# Patient Record
Sex: Female | Born: 1950 | Race: White | Hispanic: No | Marital: Married | Smoking: Never smoker
Health system: Southern US, Community
[De-identification: ages and names within clinical notes are randomized; demographics above are authoritative.]

## PROBLEM LIST (undated history)

## (undated) DIAGNOSIS — I10 Essential (primary) hypertension: Secondary | ICD-10-CM

## (undated) DIAGNOSIS — E785 Hyperlipidemia, unspecified: Secondary | ICD-10-CM

## (undated) HISTORY — PX: TUBAL LIGATION: SHX77

## (undated) HISTORY — DX: Essential (primary) hypertension: I10

## (undated) HISTORY — DX: Hyperlipidemia, unspecified: E78.5

---

## 2002-08-03 ENCOUNTER — Ambulatory Visit (HOSPITAL_COMMUNITY): Admission: RE | Admit: 2002-08-03 | Discharge: 2002-08-03 | Payer: Self-pay

## 2003-12-14 ENCOUNTER — Ambulatory Visit (HOSPITAL_COMMUNITY): Admission: RE | Admit: 2003-12-14 | Discharge: 2003-12-14 | Payer: Self-pay | Admitting: Internal Medicine

## 2005-05-11 ENCOUNTER — Ambulatory Visit: Payer: Self-pay | Admitting: Gastroenterology

## 2005-05-25 ENCOUNTER — Ambulatory Visit: Payer: Self-pay | Admitting: Gastroenterology

## 2006-10-02 ENCOUNTER — Encounter: Admission: RE | Admit: 2006-10-02 | Discharge: 2006-10-02 | Payer: Self-pay | Admitting: Obstetrics and Gynecology

## 2008-05-06 ENCOUNTER — Ambulatory Visit: Payer: Self-pay | Admitting: Obstetrics & Gynecology

## 2008-05-11 ENCOUNTER — Ambulatory Visit (HOSPITAL_COMMUNITY): Admission: RE | Admit: 2008-05-11 | Discharge: 2008-05-11 | Payer: Self-pay | Admitting: Obstetrics & Gynecology

## 2008-06-02 ENCOUNTER — Ambulatory Visit: Payer: Self-pay | Admitting: Obstetrics and Gynecology

## 2010-10-15 ENCOUNTER — Encounter: Payer: Self-pay | Admitting: Obstetrics and Gynecology

## 2011-02-06 NOTE — Group Therapy Note (Signed)
NAMESHAWNTE, Andrea Morrow NO.:  [id number]   MEDICAL RECORD NO.:  [id number]          PATIENT TYPE:  WOC   LOCATION:  [facility]                   FACILITY:  [facility]   PHYSICIAN:  Argentina Donovan, MD        DATE OF BIRTH:  04/14/1951   DATE OF SERVICE:  06/02/2008                                  CLINIC NOTE   REASON FOR VISIT:  Followup from May 06, 2008, visit.   HISTORY:  This is a 60 year old G3 P3 who was originally referred here  from [facility] when they did her Pap screen in May for evaluation of  possible vulvar lesion needing biopsy.  She was seen by Dr. Marice Potter on  May 06, 2008, and there were no vulvar lesions.  She did have some  small perirectal lesions that appeared consistent with herpes, and she  did have hemorrhoids at that visit, so the plan was made to return here  to see if there was any lesion needing a biopsy.  Since she was seen,  she has had no further itching although she does think that her  hemorrhoids may be worse.  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  Denies  constipation and uses sitz baths in order to have bowel movements when  she does feel constipation as she has in the past.  She is unaware of  ever having herpes.   OBJECTIVE:  BP 130/93, weight 157, height 5'1.  Abbreviated physical  exam.  External genitalia:  Again, no vulvar lesion is seen.  There  appears to be some glistening skin of the posterior perirectal area  consistent with mild irritation.  There are three or four 1 cm sized  anterior external hemorrhoids visible.  No thrombosis or tenderness.   ASSESSMENT:  1. Hemorrhoids with mild constipation.  2. Chronic hypertension.  3. Hyperlipidemia.  4. No evidence of herpes simplex virus II by antibody testing.   PLAN:  Discussed all the usual treatments for hemorrhoids and danger  signs of thrombosis.  Urged to alter her diet to decrease constipation  or try stool softener if ever needed.  She is reassured that HSV II  antibody is negative and that she has no lesions of the vulva.  Followup  will be at [facility] for her routine Pap and with her primary  physician, who manages her chronic hypertension.  She is also advised  that her mammogram of August 18 is negative.  Return here p.r.n.     ______________________________  Caren Griffins, CNM    ______________________________  Argentina Donovan, MD    DP/MEDQ  D:  06/02/2008  T:  06/02/2008  Job:  [id number]

## 2012-09-12 ENCOUNTER — Telehealth: Payer: Self-pay | Admitting: *Deleted

## 2012-09-12 MED ORDER — SIMVASTATIN 40 MG PO TABS: 40.0000 mg | ORAL_TABLET | Freq: Every day | ORAL | Status: DC

## 2012-09-12 MED ORDER — HYDROCHLOROTHIAZIDE 25 MG PO TABS: 25.0000 mg | ORAL_TABLET | Freq: Every day | ORAL | Status: DC

## 2012-09-12 NOTE — Telephone Encounter (Signed)
Message copied by Mora Bellman on Fri Sep 12, 2012 11:59 AM ------      Message from: Lizbeth Bark      Created: Fri Sep 12, 2012 10:27 AM      Regarding: FW: phone message      Contact: [phone]       See below about refills      ----- Message -----         From: Ralene Cork, DO         Sent: 09/12/2012  10:06 AM           To: Inez Pilgrim Ceresi      Subject: RE: phone message                                        Needs to find a new PCP... I recommend Dr Silas [place] at [facility] or Dr Larita Fife in [place].      Ok to refill both meds for one month. Forward message to Roselene Gray or [place] to call these in. Each med is one po QD.            ----- Message -----         From: Lizbeth Bark         Sent: 09/12/2012   9:07 AM           To: Ralene Cork, DO      Subject: phone message                                            Pt wants to know if you still want to be her families PCP?  If not please give some recommendations on where they should go.  Thanks!            Also, she needs a refill on her meds. Simvastatin 40 mg, Hydrochlothiazide 25 mg. 100 tablets  Pharmacy is [facility]

## 2012-10-08 ENCOUNTER — Ambulatory Visit: Payer: Self-pay | Admitting: Family Medicine

## 2012-10-08 VITALS — BP 156/85 | HR 75 | Temp 98.4°F | Resp 18 | Ht 61.5 in | Wt 162.0 lb

## 2012-10-08 DIAGNOSIS — E785 Hyperlipidemia, unspecified: Secondary | ICD-10-CM

## 2012-10-08 DIAGNOSIS — I10 Essential (primary) hypertension: Secondary | ICD-10-CM

## 2012-10-08 LAB — COMPREHENSIVE METABOLIC PANEL
ALT: 15 U/L (ref 0–35)
Albumin: 4.8 g/dL (ref 3.5–5.2)
CO2: 31 mEq/L (ref 19–32)
Calcium: 10 mg/dL (ref 8.4–10.5)
Chloride: 103 mEq/L (ref 96–112)
Glucose, Bld: 89 mg/dL (ref 70–99)
Sodium: 142 mEq/L (ref 135–145)
Total Protein: 7 g/dL (ref 6.0–8.3)

## 2012-10-08 LAB — LIPID PANEL
Cholesterol: 224 mg/dL — ABNORMAL HIGH (ref 0–200)
Triglycerides: 166 mg/dL — ABNORMAL HIGH (ref <150)

## 2012-10-08 MED ORDER — SIMVASTATIN 40 MG PO TABS: 40.0000 mg | ORAL_TABLET | Freq: Every day | ORAL | Status: DC

## 2012-10-08 MED ORDER — HYDROCHLOROTHIAZIDE 25 MG PO TABS: 25.0000 mg | ORAL_TABLET | Freq: Every day | ORAL | Status: DC

## 2012-10-08 NOTE — Patient Instructions (Signed)
Keep a record of your blood pressures outside of the office and bring them to the next office visit. If they are running over 140/90 - you may need to have another medicine added. Work on walking/exercise as we discussed, and recheck in 6 months. Return to the clinic or go to the nearest emergency room if any of your symptoms worsen or new symptoms occur. We will request records form Dr. Gilmer Mor office.

## 2012-10-08 NOTE — Progress Notes (Signed)
Subjective:    Patient ID: Andrea Morrow, female    DOB: 1950-12-01, 62 y.o.   MRN: [id number]  HPI Andrea Morrow is a 62 y.o. female Prior patient of Dr. Margaretha Sheffield.  Hx of HTN , hyperlipidemia.  Last ate about 7-8 hours. \ Outside BP's - 120-130/70-80's. No missed doses of BP meds.  No new myalgias or other new side effects of Zocor.  Same doses of meds past few years.  [place]are exercise[place]  11/13 - had normal pap smear at [facility], mammogram normal 2013. Colonoscopy in 2007 - normal - follow up in 10 years. No flu vaccine this year.   Past Medical History  Diagnosis Date  . Hypertension   . Hyperlipidemia    Current Outpatient Prescriptions on File Prior to Visit  Medication Sig Dispense Refill  . hydrochlorothiazide (HYDRODIURIL) 25 MG tablet Take 1 tablet (25 mg total) by mouth daily.  90 tablet  1  . simvastatin (ZOCOR) 40 MG tablet Take 1 tablet (40 mg total) by mouth daily.  90 tablet  1   No Known Allergies Past Surgical History  Procedure Date  . Tubal ligation    History   Social History  . Marital Status: Married    Spouse Name: N/A    Number of Children: N/A  . Years of Education: N/A   Occupational History  . Not on file.   Social History Main Topics  . Smoking status: Never Smoker   . Smokeless tobacco: Not on file  . Alcohol Use: Yes  . Drug Use: No  . Sexually Active: Not on file   Other Topics Concern  . Not on file   Social History Narrative  . No narrative on file      Review of Systems  Constitutional: Negative for fatigue and unexpected weight change.  Respiratory: Negative for chest tightness and shortness of breath.   Cardiovascular: Negative for chest pain, palpitations and leg swelling.  Gastrointestinal: Negative for abdominal pain and blood in stool.  Neurological: Negative for dizziness, syncope, light-headedness and headaches.       Objective:   Physical Exam  Vitals  reviewed. Constitutional: She is oriented to person, place, and time. She appears well-developed and well-nourished.  HENT:  Head: Normocephalic and atraumatic.  Eyes: Conjunctivae normal and EOM are normal. Pupils are equal, round, and reactive to light.  Neck: Carotid bruit is not present.  Cardiovascular: Normal rate, regular rhythm, normal heart sounds and intact distal pulses.   Pulmonary/Chest: Effort normal and breath sounds normal.  Abdominal: Soft. She exhibits no pulsatile midline mass. There is no tenderness.  Neurological: She is alert and oriented to person, place, and time.  Skin: Skin is warm and dry.  Psychiatric: She has a normal mood and affect. Her behavior is normal.       Assessment & Plan:  Andrea Morrow is a 62 y.o. female 1. HTN (hypertension)  Comprehensive metabolic panel, hydrochlorothiazide (HYDRODIURIL) 25 MG tablet  2. Hyperlipidemia  Comprehensive metabolic panel, Lipid panel, simvastatin (ZOCOR) 40 MG tablet   HTn - borderline high, but controlled at home.  No change in meds at present, but as below if running high - rtc.   Hyperlipidemia - check lipids, CMP.  Continue same meds for now.   Recheck in 6 months.  Patient Instructions  Keep a record of your blood pressures outside of the office and bring them to the next office visit. If they are  running over 140/90 - you may need to have another medicine added. Work on walking/exercise as we discussed, and recheck in 6 months. Return to the clinic or go to the nearest emergency room if any of your symptoms worsen or new symptoms occur. We will request records form Dr. Gilmer Mor office.

## 2013-04-22 ENCOUNTER — Other Ambulatory Visit: Payer: Self-pay | Admitting: Family Medicine

## 2013-04-27 ENCOUNTER — Encounter: Payer: Self-pay | Admitting: Family Medicine

## 2013-04-27 ENCOUNTER — Ambulatory Visit: Payer: Self-pay | Admitting: Family Medicine

## 2013-04-27 VITALS — BP 126/82 | HR 77 | Temp 98.6°F | Resp 16 | Ht 61.5 in | Wt 164.0 lb

## 2013-04-27 DIAGNOSIS — M25561 Pain in right knee: Secondary | ICD-10-CM

## 2013-04-27 DIAGNOSIS — E785 Hyperlipidemia, unspecified: Secondary | ICD-10-CM

## 2013-04-27 DIAGNOSIS — I10 Essential (primary) hypertension: Secondary | ICD-10-CM

## 2013-04-27 LAB — COMPREHENSIVE METABOLIC PANEL
AST: 18 U/L (ref 0–37)
BUN: 21 mg/dL (ref 6–23)
CO2: 31 mEq/L (ref 19–32)
Chloride: 101 mEq/L (ref 96–112)
Creat: 0.77 mg/dL (ref 0.50–1.10)
Glucose, Bld: 91 mg/dL (ref 70–99)
Potassium: 4 mEq/L (ref 3.5–5.3)
Total Protein: 7 g/dL (ref 6.0–8.3)

## 2013-04-27 MED ORDER — HYDROCHLOROTHIAZIDE 25 MG PO TABS: 25.0000 mg | ORAL_TABLET | Freq: Every day | ORAL | Status: DC

## 2013-04-27 MED ORDER — SIMVASTATIN 40 MG PO TABS: 40.0000 mg | ORAL_TABLET | Freq: Every day | ORAL | Status: DC

## 2013-04-27 NOTE — Patient Instructions (Signed)
Schedule physical in next 3-6 months.  Will check cholesterol then. Tylenol or if needed - ibuprofen as needed for right knee pain - if this worsens - recheck.  You should receive a call or letter about your lab results within the next week to 10 days.

## 2013-04-27 NOTE — Progress Notes (Signed)
Subjective:    Patient ID: Andrea Morrow, female    DOB: [birth year], 62 y.o.   MRN: [id number]  HPI Andrea Morrow is a 62 y.o. female  HTn - borderline high last ov with better readings at home. Continued hctz 25mg  qd. Home readings:  114-145/75-89. No chest pains, Occasional calf soreness, cramps. - both sides - goes away with ibuprofen. Occasional soreness in R knee with bending in. No injury.  No soreness currently - walking ok, not noticed during day, no mechanical sx's.   Hyperlipidemia - lipids, CMP as below from last ov in January. Continued on Zocor 40mg  qd. Planned on exercise, healthy food choices and recheck 6 months. Weight 162 that visit. No new diet, no new exercise regimen.  Exercise: less than 1 day per week. Minimal sweet tea, minimal sodas. Plans on increasing walking. Occasional soreness in calves, no other new myalgias.  Results for orders placed in visit on 10/08/12  COMPREHENSIVE METABOLIC PANEL      Result Value Range   Sodium 142  135 - 145 mEq/L   Potassium 4.0  3.5 - 5.3 mEq/L   Chloride 103  96 - 112 mEq/L   CO2 31  19 - 32 mEq/L   Glucose, Bld 89  70 - 99 mg/dL   BUN 16  6 - 23 mg/dL   Creat 7.82  9.56 - 2.13 mg/dL   Total Bilirubin 0.4  0.3 - 1.2 mg/dL   Alkaline Phosphatase 57  39 - 117 U/L   AST 21  0 - 37 U/L   ALT 15  0 - 35 U/L   Total Protein 7.0  6.0 - 8.3 g/dL   Albumin 4.8  3.5 - 5.2 g/dL   Calcium 08.6  8.4 - 57.8 mg/dL  LIPID PANEL      Result Value Range   Cholesterol 224 (*) 0 - 200 mg/dL   Triglycerides 469 (*) <150 mg/dL   HDL 70  >62 mg/dL   Total CHOL/HDL Ratio 3.2     VLDL 33  0 - 40 mg/dL   LDL Cholesterol 952 (*) 0 - 99 mg/dL   Last physical last year?  Review of Systems  Constitutional: Negative for fatigue and unexpected weight change.  Respiratory: Negative for chest tightness and shortness of breath.   Cardiovascular: Negative for chest pain, palpitations and leg swelling.  Gastrointestinal: Negative for abdominal pain  and blood in stool.  Musculoskeletal: Positive for arthralgias.  Skin: Negative for rash.  Neurological: Negative for dizziness, syncope, light-headedness and headaches.       Objective:   Physical Exam  Vitals reviewed. Constitutional: She is oriented to person, place, and time. She appears well-developed and well-nourished.  HENT:  Head: Normocephalic and atraumatic.  Eyes: Conjunctivae and EOM are normal. Pupils are equal, round, and reactive to light.  Neck: Carotid bruit is not present.  Cardiovascular: Normal rate, regular rhythm, normal heart sounds and intact distal pulses.   Pulmonary/Chest: Effort normal and breath sounds normal.  Abdominal: Soft. She exhibits no pulsatile midline mass. There is no tenderness.  Musculoskeletal:       Right knee: She exhibits normal range of motion, no swelling, no effusion, no erythema, normal alignment, no LCL laxity, normal meniscus (negative mc murray. ) and no MCL laxity. Tenderness found. Medial joint line tenderness noted. No lateral joint line, no MCL, no LCL and no patellar tendon tenderness noted.       Right lower leg: She exhibits no tenderness and  no swelling.       Left lower leg: She exhibits no tenderness and no swelling.  Neurological: She is alert and oriented to person, place, and time.  Skin: Skin is warm and dry.  Psychiatric: She has a normal mood and affect. Her behavior is normal.      Assessment & Plan:  Andrea Morrow is a 62 y.o. female Hyperlipidemia - Plan: Comprehensive metabolic panel, simvastatin (ZOCOR) 40 MG tablet.  Borderline prior, but good HDL.  Discussed diet changes and walking for exercise.   HTN (hypertension) - Plan: Comprehensive metabolic panel, hydrochlorothiazide (HYDRODIURIL) 25 MG tablet.  Controlled.  meds refilled. Will check lytes with hx intermittent calf myalgias. rtc if change/increase in sx's.    Right knee pain - ttp over medial jt line - possible oa vs degenerative meniscal dz.  If  more frequent sxs - rtc for recheck or XR.   Plan on CPE in next 6 months.    Meds ordered this encounter  Medications  . hydrochlorothiazide (HYDRODIURIL) 25 MG tablet    Sig: Take 1 tablet (25 mg total) by mouth daily.    Dispense:  90 tablet    Refill:  1  . simvastatin (ZOCOR) 40 MG tablet    Sig: Take 1 tablet (40 mg total) by mouth daily.    Dispense:  90 tablet    Refill:  1   Patient Instructions  Schedule physical in next 3-6 months.  Will check cholesterol then. Tylenol or if needed - ibuprofen as needed for right knee pain - if this worsens - recheck.  You should receive a call or letter about your lab results within the next week to 10 days.

## 2013-08-10 ENCOUNTER — Ambulatory Visit: Payer: Self-pay | Admitting: Family Medicine

## 2013-08-10 ENCOUNTER — Encounter: Payer: Self-pay | Admitting: Family Medicine

## 2013-08-10 VITALS — BP 124/86 | HR 80 | Temp 98.6°F | Resp 12 | Ht 61.75 in | Wt 166.6 lb

## 2013-08-10 DIAGNOSIS — E785 Hyperlipidemia, unspecified: Secondary | ICD-10-CM

## 2013-08-10 DIAGNOSIS — I10 Essential (primary) hypertension: Secondary | ICD-10-CM

## 2013-08-10 NOTE — Patient Instructions (Addendum)
Exercise - walking is a great start - most days of the week and portion control. recheck in 3 months for physical (fasting labs that morning). If motivating - step tracker/pedometer or fitness tracking bracelet may help.    No change in meds at this point. Let me know if there are any questions before your next appointment, and if refills needed before that appointment - please let me know.

## 2013-08-10 NOTE — Progress Notes (Signed)
Subjective:    Patient ID: Andrea Morrow, female    DOB: [birth year], 62 y.o.   MRN: [id number]  HPI [name] is a 62 y.o. female  Last OV 04/2013.   HTN - creat 0.77 in August. Still taking HCTZ 25mg  QD. Home BP128/80 this am.  Usually 118-124/80's. No new side effects of meds.   Hyperlipidemia -  on Zocor 40mg  qd.  No new diet, exercise: less than 1 day per week at last ov - now even less. Busy with family and traveling and work. Prior walking for exercise.  Daughter moved to [place] in June,  family with surgeries. Ran out of fish oil but was tolerating this.    Results for orders placed in visit on 04/27/13  COMPREHENSIVE METABOLIC PANEL      Result Value Range   Sodium 137  135 - 145 mEq/L   Potassium 4.0  3.5 - 5.3 mEq/L   Chloride 101  96 - 112 mEq/L   CO2 31  19 - 32 mEq/L   Glucose, Bld 91  70 - 99 mg/dL   BUN 21  6 - 23 mg/dL   Creat 6.21  3.08 - 6.57 mg/dL   Total Bilirubin 0.4  0.3 - 1.2 mg/dL   Alkaline Phosphatase 54  39 - 117 U/L   AST 18  0 - 37 U/L   ALT 14  0 - 35 U/L   Total Protein 7.0  6.0 - 8.3 g/dL   Albumin 4.5  3.5 - 5.2 g/dL   Calcium 9.8  8.4 - 84.6 mg/dL   Lab Results  Component Value Date   CHOL 224* 10/08/2012   Lab Results  Component Value Date   HDL 70 10/08/2012   Lab Results  Component Value Date   LDLCALC 121* 10/08/2012   Lab Results  Component Value Date   TRIG 166* 10/08/2012   Lab Results  Component Value Date   CHOLHDL 3.2 10/08/2012   No results found for this basename: LDLDIRECT   Review of Systems  Constitutional: Negative for fatigue and unexpected weight change.  Respiratory: Negative for chest tightness and shortness of breath.   Cardiovascular: Negative for chest pain, palpitations and leg swelling.  Gastrointestinal: Negative for abdominal pain and blood in stool.  Neurological: Negative for dizziness, syncope, light-headedness and headaches.       Objective:   Physical Exam  Vitals  reviewed. Constitutional: She is oriented to person, place, and time. She appears well-developed and well-nourished.  HENT:  Head: Normocephalic and atraumatic.  Eyes: Conjunctivae and EOM are normal. Pupils are equal, round, and reactive to light.  Neck: Carotid bruit is not present.  Cardiovascular: Normal rate, regular rhythm, normal heart sounds and intact distal pulses.   Pulmonary/Chest: Effort normal and breath sounds normal.  Abdominal: Soft. She exhibits no pulsatile midline mass. There is no tenderness.  Neurological: She is alert and oriented to person, place, and time.  Skin: Skin is warm and dry.  Psychiatric: She has a normal mood and affect. Her behavior is normal.        Assessment & Plan:  Andrea Morrow is a 62 y.o. female HTN (hypertension) - stable.controlled. Exercise discussed as below. Fasting labs at physical in few months.   Other and unspecified hyperlipidemia - not at control at last check and discussed exercise and diet/protion control necessity.reasonable goals and ways to incorporate exercise into daily routine discussed.   Not fasting today - plan on lipids at  physical in next 3 months.   No orders of the defined types were placed in this encounter.   Patient Instructions  Exercise - walking is a great start - most days of the week and portion control. recheck in 3 months for physical (fasting labs that morning). If motivating - step tracker/pedometer or fitness tracking bracelet may help.    No change in meds at this point. Let me know if there are any questions before your next appointment, and if refills needed before that appointment - please let me know.

## 2013-08-11 NOTE — Progress Notes (Signed)
Patient made appointment for Feb. 2015.

## 2013-11-16 ENCOUNTER — Encounter: Payer: Self-pay | Admitting: Family Medicine

## 2013-11-16 ENCOUNTER — Ambulatory Visit: Payer: Self-pay | Admitting: Family Medicine

## 2013-11-16 VITALS — BP 146/90 | HR 76 | Temp 98.0°F | Resp 16 | Ht 61.5 in | Wt 167.8 lb

## 2013-11-16 DIAGNOSIS — I10 Essential (primary) hypertension: Secondary | ICD-10-CM

## 2013-11-16 DIAGNOSIS — E785 Hyperlipidemia, unspecified: Secondary | ICD-10-CM

## 2013-11-16 DIAGNOSIS — E782 Mixed hyperlipidemia: Secondary | ICD-10-CM

## 2013-11-16 LAB — COMPLETE METABOLIC PANEL WITH GFR
ALT: 16 U/L (ref 0–35)
AST: 22 U/L (ref 0–37)
Albumin: 4.6 g/dL (ref 3.5–5.2)
Alkaline Phosphatase: 51 U/L (ref 39–117)
BUN: 13 mg/dL (ref 6–23)
CO2: 30 mEq/L (ref 19–32)
CREATININE: 0.83 mg/dL (ref 0.50–1.10)
Calcium: 9.8 mg/dL (ref 8.4–10.5)
Chloride: 102 mEq/L (ref 96–112)
GFR, Est African American: 88 mL/min
GFR, Est Non African American: 76 mL/min
Glucose, Bld: 90 mg/dL (ref 70–99)
Potassium: 4.1 mEq/L (ref 3.5–5.3)
Sodium: 141 mEq/L (ref 135–145)
TOTAL PROTEIN: 7.4 g/dL (ref 6.0–8.3)
Total Bilirubin: 0.5 mg/dL (ref 0.2–1.2)

## 2013-11-16 LAB — LIPID PANEL
CHOL/HDL RATIO: 2.9 ratio
Cholesterol: 200 mg/dL (ref 0–200)
HDL: 69 mg/dL (ref >39)
LDL Cholesterol: 112 mg/dL — ABNORMAL HIGH (ref 0–99)
TRIGLYCERIDES: 94 mg/dL (ref <150)
VLDL: 19 mg/dL (ref 0–40)

## 2013-11-16 MED ORDER — HYDROCHLOROTHIAZIDE 25 MG PO TABS: 25.0000 mg | ORAL_TABLET | Freq: Every day | ORAL | Status: DC

## 2013-11-16 MED ORDER — SIMVASTATIN 40 MG PO TABS: 40.0000 mg | ORAL_TABLET | Freq: Every day | ORAL | Status: DC

## 2013-11-16 NOTE — Patient Instructions (Signed)
Continue same doses of medicines for now. You should receive a call or letter about your lab results within the next week to 10 days.  Physical in 3 months. If any new symptoms prior to that time - especially with exercise -return sooner to discuss.  Bring your blood pressure meter to next visit.

## 2013-11-16 NOTE — Progress Notes (Signed)
Subjective:    Patient ID: Andrea Morrow, female    DOB: Jun 03, 1951, 63 y.o.   MRN: [id number]  HPI Andrea Morrow is a 63 y.o. female Here for follow up.    HTN - on hctz 25mg  qd. Outside Bp's - 125-132/80-83 on home cuff.  No new side effects. Father died at 60 with MI, no known earlier CAD/early FH CAD.  mom living - 52 yo. No chest pain with exercise.  No new dyspnea on exertion. No missed doses of meds.     Chemistry      Component Value Date/Time   NA 137 04/27/2013 1356   K 4.0 04/27/2013 1356   CL 101 04/27/2013 1356   CO2 31 04/27/2013 1356   BUN 21 04/27/2013 1356   CREATININE 0.77 04/27/2013 1356      Component Value Date/Time   CALCIUM 9.8 04/27/2013 1356   ALKPHOS 54 04/27/2013 1356   AST 18 04/27/2013 1356   ALT 14 04/27/2013 1356   BILITOT 0.4 04/27/2013 1356      Hyperlipidemia  - On zocor 40mg  qd. Fasting labs drawn today. No new myalgias. Exercise: no new exercise, but [place]   Lab Results  Component Value Date   CHOL 224* 10/08/2012   Lab Results  Component Value Date   HDL 70 10/08/2012   Lab Results  Component Value Date   LDLCALC 121* 10/08/2012   Lab Results  Component Value Date   TRIG 166* 10/08/2012   Lab Results  Component Value Date   CHOLHDL 3.2 10/08/2012   No results found for this basename: LDLDIRECT   There are no active problems to display for this patient.  Past Medical History  Diagnosis Date  . Hypertension   . Hyperlipidemia    Past Surgical History  Procedure Laterality Date  . Tubal ligation     No Known Allergies Prior to Admission medications   Medication Sig Start Date End Date Taking? Authorizing Provider  hydrochlorothiazide (HYDRODIURIL) 25 MG tablet Take 1 tablet (25 mg total) by mouth daily. 04/27/13  Yes [name], MD  Multiple Vitamins-Calcium (ONE-A-DAY WOMENS PO) Take by mouth.   Yes Historical Provider, MD  Omega-3 Fatty Acids (FISH OIL PO) Take by mouth.   Yes Historical Provider, MD    simvastatin (ZOCOR) 40 MG tablet Take 1 tablet (40 mg total) by mouth daily. 04/27/13  Yes [name], MD   History   Social History  . Marital Status: Married    Spouse Name: N/A    Number of Children: N/A  . Years of Education: N/A   Occupational History  . Not on file.   Social History Main Topics  . Smoking status: Never Smoker   . Smokeless tobacco: Not on file  . Alcohol Use: Yes  . Drug Use: No  . Sexual Activity: Not on file   Other Topics Concern  . Not on file   Social History Narrative  . No narrative on file    Review of Systems  Constitutional: Negative for fatigue and unexpected weight change.  Respiratory: Negative for chest tightness and shortness of breath.   Cardiovascular: Negative for chest pain, palpitations and leg swelling.  Gastrointestinal: Negative for abdominal pain and blood in stool.  Neurological: Negative for dizziness, syncope, light-headedness and headaches.       Objective:   Physical Exam  Vitals reviewed. Constitutional: She is oriented to person, place, and time. She appears  well-developed and well-nourished.  HENT:  Head: Normocephalic and atraumatic.  Eyes: Conjunctivae and EOM are normal. Pupils are equal, round, and reactive to light.  Neck: Carotid bruit is not present.  Cardiovascular: Normal rate, regular rhythm, normal heart sounds and intact distal pulses.   Pulmonary/Chest: Effort normal and breath sounds normal.  Abdominal: Soft. She exhibits no pulsatile midline mass. There is no tenderness.  Neurological: She is alert and oriented to person, place, and time.  Skin: Skin is warm and dry.  Psychiatric: She has a normal mood and affect. Her behavior is normal.   Filed Vitals:   11/16/13 1321  BP: 146/90  Pulse: 76  Temp: 98 F (36.7 C)  TempSrc: Oral  Resp: 16  Height: 5' 1.5" (1.562 m)  Weight: 167 lb 12.8 oz (76.114 kg)  SpO2: 98%        Assessment & Plan:   Andrea Morrow is a 63 y.o.  female HTN (hypertension) - Plan: hydrochlorothiazide (HYDRODIURIL) 25 MG tablet, COMPLETE METABOLIC PANEL WITH GFR, Lipid panel  -possible white coat component. Check home bp's, bring meter to next ov to verify accuracy, CMP GFR and lipids pending, CPE in next few months. Activity/exercise discussed.   Hyperlipidemia - Plan: simvastatin (ZOCOR) 40 MG tablet, COMPLETE METABOLIC PANEL WITH GFR, Lipid panel  -cont zocor at current dose until results of lipid panel.    Meds ordered this encounter  Medications  . hydrochlorothiazide (HYDRODIURIL) 25 MG tablet    Sig: Take 1 tablet (25 mg total) by mouth daily.    Dispense:  90 tablet    Refill:  1  . simvastatin (ZOCOR) 40 MG tablet    Sig: Take 1 tablet (40 mg total) by mouth daily.    Dispense:  90 tablet    Refill:  1   Patient Instructions  Continue same doses of medicines for now. You should receive a call or letter about your lab results within the next week to 10 days.  Physical in 3 months. If any new symptoms prior to that time - especially with exercise -return sooner to discuss.  Bring your blood pressure meter to next visit.

## 2014-01-28 ENCOUNTER — Encounter: Payer: Self-pay | Admitting: *Deleted

## 2014-01-28 DIAGNOSIS — Z1231 Encounter for screening mammogram for malignant neoplasm of breast: Secondary | ICD-10-CM | POA: Insufficient documentation

## 2014-02-18 ENCOUNTER — Encounter: Payer: Self-pay | Admitting: Family Medicine

## 2014-05-10 ENCOUNTER — Other Ambulatory Visit: Payer: Self-pay | Admitting: Family Medicine

## 2014-05-17 ENCOUNTER — Encounter: Payer: Self-pay | Admitting: Family Medicine

## 2014-06-16 ENCOUNTER — Other Ambulatory Visit: Payer: Self-pay | Admitting: Family Medicine

## 2014-09-27 ENCOUNTER — Encounter: Payer: Self-pay | Admitting: Family Medicine

## 2014-09-27 ENCOUNTER — Ambulatory Visit (INDEPENDENT_AMBULATORY_CARE_PROVIDER_SITE_OTHER): Payer: Self-pay | Admitting: Family Medicine

## 2014-09-27 VITALS — BP 131/84 | HR 91 | Temp 98.0°F | Resp 16 | Ht 61.75 in | Wt 166.6 lb

## 2014-09-27 DIAGNOSIS — Z23 Encounter for immunization: Secondary | ICD-10-CM

## 2014-09-27 DIAGNOSIS — Z0000 Encounter for general adult medical examination without abnormal findings: Secondary | ICD-10-CM

## 2014-09-27 DIAGNOSIS — E785 Hyperlipidemia, unspecified: Secondary | ICD-10-CM

## 2014-09-27 DIAGNOSIS — I10 Essential (primary) hypertension: Secondary | ICD-10-CM

## 2014-09-27 LAB — COMPLETE METABOLIC PANEL WITH GFR
ALT: 21 U/L (ref 0–35)
AST: 25 U/L (ref 0–37)
Albumin: 4.6 g/dL (ref 3.5–5.2)
Alkaline Phosphatase: 57 U/L (ref 39–117)
BILIRUBIN TOTAL: 0.6 mg/dL (ref 0.2–1.2)
BUN: 17 mg/dL (ref 6–23)
CO2: 28 mEq/L (ref 19–32)
CREATININE: 0.85 mg/dL (ref 0.50–1.10)
Calcium: 10.1 mg/dL (ref 8.4–10.5)
Chloride: 100 mEq/L (ref 96–112)
GFR, EST AFRICAN AMERICAN: 85 mL/min
GFR, EST NON AFRICAN AMERICAN: 74 mL/min
GLUCOSE: 89 mg/dL (ref 70–99)
Potassium: 3.8 mEq/L (ref 3.5–5.3)
Sodium: 139 mEq/L (ref 135–145)
TOTAL PROTEIN: 7.5 g/dL (ref 6.0–8.3)

## 2014-09-27 LAB — LIPID PANEL
CHOLESTEROL: 251 mg/dL — AB (ref 0–200)
HDL: 82 mg/dL (ref >39)
LDL Cholesterol: 148 mg/dL — ABNORMAL HIGH (ref 0–99)
Total CHOL/HDL Ratio: 3.1 Ratio
Triglycerides: 107 mg/dL (ref <150)
VLDL: 21 mg/dL (ref 0–40)

## 2014-09-27 MED ORDER — SIMVASTATIN 40 MG PO TABS: 40.0000 mg | ORAL_TABLET | Freq: Every day | ORAL | Status: DC

## 2014-09-27 MED ORDER — HYDROCHLOROTHIAZIDE 25 MG PO TABS: 25.0000 mg | ORAL_TABLET | Freq: Every day | ORAL | Status: DC

## 2014-09-27 NOTE — Progress Notes (Signed)
   Subjective:    Patient ID: Andrea Morrow, female    DOB: 08-09-1951, 64 y.o.   MRN: [id number]  HPI    Review of Systems  Constitutional: Negative.   HENT: Negative.   Eyes: Negative.   Respiratory: Negative.   Cardiovascular: Negative.   Gastrointestinal: Negative.   Endocrine: Negative.   Genitourinary: Negative.   Musculoskeletal: Negative.   Skin: Negative.   Allergic/Immunologic: Negative.   Neurological: Negative.   Hematological: Negative.   Psychiatric/Behavioral: Negative.        Objective:   Physical Exam        Assessment & Plan:

## 2014-09-27 NOTE — Progress Notes (Addendum)
Subjective:    Patient ID: Andrea Morrow, female    DOB: 1950/12/05, 64 y.o.   MRN: [id number] This chart was scribed for [name], MD by Zola Button, Medical Scribe. This patient was seen in Room 28 and the patient's care was started at 1:59 PM.   HPI HPI Comments: Andrea Morrow is a 64 y.o. female who presents to the [facility] for a complete physical exam.   Health Maintenance: -Exercise: She states she has not been getting enough exercise recently. -Eye care: Optho last year, she has an upcoming last year. -Dentist: She will have an appointment in a few months. -Fall screening: negative. -Depression screening: negative with PHQ2 score of 0.  -Colonoscopy: per patient report, this was 8 years ago. No suspicious findings. She was told to repeat in 10 years. -Mammogram: April 2015. No suspicious findings. -Cervical cancer screening: Patient last had a pap smear 1 year ago which was normal. She has not had any abnormal pap smears.  -Immunization: She declines a flu vaccine today, but plans to get one at [facility]. Tetanus: patient is unsure when her last tetanus shot was, but may be around 10 years ago. TDAP: she does not think she is UTD. -Bone density screening: per patient report, 8 years ago. She is not a smoker. Body mass index is 30.74 kg/(m^2). No increased risk of osteoporosis from review of risk factors on Up To Date. Plan on BMD at 60.  Advanced Directive: She has done the advanced directive, but she says that some changes need to be made.  HTN: Takes HCTZ 25 mg QD. She has not been taking her BP recently; she stopped checking around September 2015. Last kidney function test was normal February 2015. Patient has not had anything to eat or drink today. She denies any side effects due to her medications.  HLD: Lipid panel: She had a lipid panel with LDL 112 in February 2015. Otherwise normal. CMP within normal range at that time. Was advised to start ASA  once a day for stroke prevention. No known contraindications of ASA use. No hx of ulcers.  Patient has been having concerns about cancer. Her mother recently passed away at the age of 63; she was initially diagnosed with breast cancer in her 33s and had them removed. Her younger maternal cousin who turned 48 recently was diagnosed with colon cancer. Her 47 year old aunt and another cousin also have breast cancer. She is concerned because of cancer on her mother's side of the family. None of her siblings have been diagnosed with cancer.    Patient Active Problem List   Diagnosis Date Noted  . Other screening mammogram 01/28/2014   Past Medical History  Diagnosis Date  . Hypertension   . Hyperlipidemia    Past Surgical History  Procedure Laterality Date  . Tubal ligation     No Known Allergies Prior to Admission medications   Medication Sig Start Date End Date Taking? Authorizing Provider  hydrochlorothiazide (HYDRODIURIL) 25 MG tablet TAKE 1 TABLET BY MOUTH DAILY. 06/17/14  Yes Mancel Bale, PA-C  Multiple Vitamins-Calcium (ONE-A-DAY WOMENS PO) Take by mouth.   Yes Historical Provider, MD  simvastatin (ZOCOR) 40 MG tablet TAKE 1 TABLET BY MOUTH DAILY. 06/17/14  Yes Mancel Bale, PA-C  Omega-3 Fatty Acids (FISH OIL PO) Take by mouth.    Historical Provider, MD   History   Social History  . Marital Status: Married    Spouse Name: N/A  Number of Children: N/A  . Years of Education: N/A   Occupational History  . Not on file.   Social History Main Topics  . Smoking status: Never Smoker   . Smokeless tobacco: Not on file  . Alcohol Use: Yes  . Drug Use: No  . Sexual Activity: Not on file   Other Topics Concern  . Not on file   Social History Narrative   Married   Clinical research associate         Review of Systems  Constitutional: Negative for fatigue and unexpected weight change.  Respiratory: Negative for chest tightness and shortness of breath.   Cardiovascular:  Negative for chest pain, palpitations and leg swelling.  Gastrointestinal: Negative for abdominal pain and blood in stool.  Neurological: Negative for dizziness, syncope, light-headedness and headaches.   13 point ROS reviewed per health survey.     Objective:   Physical Exam  Constitutional: She is oriented to person, place, and time. She appears well-developed and well-nourished.  HENT:  Head: Normocephalic and atraumatic.  Right Ear: External ear normal.  Left Ear: External ear normal.  Mouth/Throat: Oropharynx is clear and moist.  Eyes: Conjunctivae and EOM are normal. Pupils are equal, round, and reactive to light.  Neck: Normal range of motion. Neck supple. Carotid bruit is not present. No thyromegaly present.  Cardiovascular: Normal rate, regular rhythm, normal heart sounds and intact distal pulses.   No murmur heard. Pulmonary/Chest: Effort normal and breath sounds normal. No respiratory distress. She has no wheezes.  Declined CBE.   Abdominal: Soft. Bowel sounds are normal. She exhibits no pulsatile midline mass. There is no tenderness.  Musculoskeletal: Normal range of motion. She exhibits no edema or tenderness.  Lymphadenopathy:    She has no cervical adenopathy.  Neurological: She is alert and oriented to person, place, and time.  Skin: Skin is warm and dry. No rash noted.  Psychiatric: She has a normal mood and affect. Her behavior is normal. Thought content normal.  Vitals reviewed.    Filed Vitals:   09/27/14 1320  BP: 131/84  Pulse: 91  Temp: 98 F (36.7 C)  TempSrc: Oral  Resp: 16  Height: 5' 1.75" (1.568 m)  Weight: 166 lb 9.6 oz (75.569 kg)  SpO2: 96%    Visual Acuity Screening   Right eye Left eye Both eyes  Without correction:     With correction: 20/15 20/20 20/15       Assessment & Plan:   Andrea Morrow is a 64 y.o. female Annual physical exam -anticipatory guidance as below in AVS, screening labs above. Health maintenance items as above in  HPI discussed/recommended as applicable. Plans on flu vaccine at [place] given.   Essential hypertension - Plan: hydrochlorothiazide (HYDRODIURIL) 25 MG tablet, COMPLETE METABOLIC PANEL WITH GFR  -stable. meds refilled at prior doses.   Hyperlipemia - Plan: simvastatin (ZOCOR) 40 MG tablet, Lipid panel.  refilled meds at same dose.    Need for Tdap vaccination - Plan: Tdap vaccine greater than or equal to 7yo IM given.    Meds ordered this encounter  Medications  . hydrochlorothiazide (HYDRODIURIL) 25 MG tablet    Sig: Take 1 tablet (25 mg total) by mouth daily.    Dispense:  90 tablet    Refill:  1  . simvastatin (ZOCOR) 40 MG tablet    Sig: Take 1 tablet (40 mg total) by mouth daily.    Dispense:  90 tablet    Refill:  1   Patient Instructions    You should receive a call or letter about your lab results within the next week to 10 days.  Get your flu shot at [place] given today.  Keeping You Healthy  Get These Tests  Blood Pressure- Have your blood pressure checked by your healthcare provider at least once a year.  Normal blood pressure is 120/80.  Weight- Have your body mass index (BMI) calculated to screen for obesity.  BMI is a measure of body fat based on height and weight.  You can calculate your own BMI at [employer]  Cholesterol- Have your cholesterol checked every year.  Diabetes- Have your blood sugar checked every year if you have high blood pressure, high cholesterol, a family history of diabetes or if you are overweight.  Pap Smear- Have a pap smear every 1 to 3 years if you have been sexually active.  If you are older than 65 and recent pap smears have been normal you may not need additional pap smears.  In addition, if you have had a hysterectomy  For benign disease additional pap smears are not necessary.  Mammogram-Yearly mammograms are essential for early detection of breast cancer  Screening for Colon Cancer- Colonoscopy starting  at age 45. Screening may begin sooner depending on your family history and other health conditions.  Follow up colonoscopy as directed by your Gastroenterologist.  Screening for Osteoporosis- Screening begins at age 55 with bone density scanning, sooner if you are at higher risk for developing Osteoporosis.  Get these medicines  Calcium with Vitamin D- Your body requires 1200-1500 mg of Calcium a day and [phone] IU of Vitamin D a day.  You can only absorb 500 mg of Calcium at a time therefore Calcium must be taken in 2 or 3 separate doses throughout the day.  Hormones- Hormone therapy has been associated with increased risk for certain cancers and heart disease.  Talk to your healthcare provider about if you need relief from menopausal symptoms.  Aspirin- Ask your healthcare provider about taking Aspirin to prevent Heart Disease and Stroke.  Get these Immuniztions  Flu shot- Every fall  Pneumonia shot- Once after the age of 70; if you are younger ask your healthcare provider if you need a pneumonia shot.  Tetanus- Every ten years.  Zostavax- Once after the age of 11 to prevent shingles.  Take these steps  Don't smoke- Your healthcare provider can help you quit. For tips on how to quit, ask your healthcare provider or go to www.smokefree.gov or call 1-800 QUIT-NOW.  Be physically active- Exercise 5 days a week for a minimum of 30 minutes.  If you are not already physically active, start slow and gradually work up to 30 minutes of moderate physical activity.  Try walking, dancing, bike riding, swimming, etc.  Eat a healthy diet- Eat a variety of healthy foods such as fruits, vegetables, whole grains, low fat milk, low fat cheeses, yogurt, lean meats, chicken, fish, eggs, dried beans, tofu, etc.  For more information go to www.thenutritionsource.org  Dental visit- Brush and floss teeth twice daily; visit your dentist twice a year.  Eye exam- Visit your Optometrist or Ophthalmologist  yearly.  Drink alcohol in moderation- Limit alcohol intake to one drink or less a day.  Never drink and drive.  Depression- Your emotional health is as important as your physical health.  If you're feeling down or losing interest in things you normally enjoy, please talk to your healthcare provider.  Seat Belts- can save your life; always wear one  Smoke/Carbon Monoxide detectors- These detectors need to be installed on the appropriate level of your home.  Replace batteries at least once a year.  Violence- If anyone is threatening or hurting you, please tell your healthcare provider.  Living Will/ Health care power of attorney- Discuss with your healthcare provider and family.    I personally performed the services described in this documentation, which was scribed in my presence. The recorded information has been reviewed and considered, and addended by me as needed.

## 2014-09-27 NOTE — Patient Instructions (Addendum)
You should receive a call or letter about your lab results within the next week to 10 days.  Get your flu shot at [place] given today.  Keeping You Healthy  Get These Tests  Blood Pressure- Have your blood pressure checked by your healthcare provider at least once a year.  Normal blood pressure is 120/80.  Weight- Have your body mass index (BMI) calculated to screen for obesity.  BMI is a measure of body fat based on height and weight.  You can calculate your own BMI at [employer]  Cholesterol- Have your cholesterol checked every year.  Diabetes- Have your blood sugar checked every year if you have high blood pressure, high cholesterol, a family history of diabetes or if you are overweight.  Pap Smear- Have a pap smear every 1 to 3 years if you have been sexually active.  If you are older than 65 and recent pap smears have been normal you may not need additional pap smears.  In addition, if you have had a hysterectomy  For benign disease additional pap smears are not necessary.  Mammogram-Yearly mammograms are essential for early detection of breast cancer  Screening for Colon Cancer- Colonoscopy starting at age 58. Screening may begin sooner depending on your family history and other health conditions.  Follow up colonoscopy as directed by your Gastroenterologist.  Screening for Osteoporosis- Screening begins at age 41 with bone density scanning, sooner if you are at higher risk for developing Osteoporosis.  Get these medicines  Calcium with Vitamin D- Your body requires 1200-1500 mg of Calcium a day and [phone] IU of Vitamin D a day.  You can only absorb 500 mg of Calcium at a time therefore Calcium must be taken in 2 or 3 separate doses throughout the day.  Hormones- Hormone therapy has been associated with increased risk for certain cancers and heart disease.  Talk to your healthcare provider about if you need relief from menopausal symptoms.  Aspirin- Ask  your healthcare provider about taking Aspirin to prevent Heart Disease and Stroke.  Get these Immuniztions  Flu shot- Every fall  Pneumonia shot- Once after the age of 23; if you are younger ask your healthcare provider if you need a pneumonia shot.  Tetanus- Every ten years.  Zostavax- Once after the age of 90 to prevent shingles.  Take these steps  Don't smoke- Your healthcare provider can help you quit. For tips on how to quit, ask your healthcare provider or go to www.smokefree.gov or call 1-800 QUIT-NOW.  Be physically active- Exercise 5 days a week for a minimum of 30 minutes.  If you are not already physically active, start slow and gradually work up to 30 minutes of moderate physical activity.  Try walking, dancing, bike riding, swimming, etc.  Eat a healthy diet- Eat a variety of healthy foods such as fruits, vegetables, whole grains, low fat milk, low fat cheeses, yogurt, lean meats, chicken, fish, eggs, dried beans, tofu, etc.  For more information go to www.thenutritionsource.org  Dental visit- Brush and floss teeth twice daily; visit your dentist twice a year.  Eye exam- Visit your Optometrist or Ophthalmologist yearly.  Drink alcohol in moderation- Limit alcohol intake to one drink or less a day.  Never drink and drive.  Depression- Your emotional health is as important as your physical health.  If you're feeling down or losing interest in things you normally enjoy, please talk to your healthcare provider.  Seat Belts- can save your life; always wear  one  Smoke/Carbon Building services engineer- These detectors need to be installed on the appropriate level of your home.  Replace batteries at least once a year.  Violence- If anyone is threatening or hurting you, please tell your healthcare provider.  Living Will/ Health care power of attorney- Discuss with your healthcare provider and family.

## 2015-01-12 DIAGNOSIS — Z1239 Encounter for other screening for malignant neoplasm of breast: Secondary | ICD-10-CM | POA: Insufficient documentation

## 2015-04-21 ENCOUNTER — Encounter: Payer: Self-pay | Admitting: Gastroenterology

## 2015-06-21 ENCOUNTER — Other Ambulatory Visit: Payer: Self-pay | Admitting: Family Medicine

## 2015-06-21 ENCOUNTER — Ambulatory Visit (INDEPENDENT_AMBULATORY_CARE_PROVIDER_SITE_OTHER): Payer: Self-pay | Admitting: Family Medicine

## 2015-06-21 VITALS — BP 138/88 | HR 74 | Temp 97.4°F | Resp 16 | Ht 62.25 in | Wt 166.8 lb

## 2015-06-21 DIAGNOSIS — R12 Heartburn: Secondary | ICD-10-CM

## 2015-06-21 DIAGNOSIS — R05 Cough: Secondary | ICD-10-CM

## 2015-06-21 DIAGNOSIS — I10 Essential (primary) hypertension: Secondary | ICD-10-CM

## 2015-06-21 DIAGNOSIS — R059 Cough, unspecified: Secondary | ICD-10-CM

## 2015-06-21 DIAGNOSIS — E785 Hyperlipidemia, unspecified: Secondary | ICD-10-CM

## 2015-06-21 MED ORDER — SIMVASTATIN 40 MG PO TABS: 40.0000 mg | ORAL_TABLET | Freq: Every day | ORAL | Status: DC

## 2015-06-21 MED ORDER — BENZONATATE 100 MG PO CAPS: 100.0000 mg | ORAL_CAPSULE | Freq: Three times a day (TID) | ORAL | Status: DC | PRN

## 2015-06-21 MED ORDER — HYDROCHLOROTHIAZIDE 25 MG PO TABS: 25.0000 mg | ORAL_TABLET | Freq: Every day | ORAL | Status: DC

## 2015-06-21 NOTE — Progress Notes (Signed)
Subjective:  This chart was scribed for Cindee Lame MD, by Tamsen Roers, at [facility].  This patient was seen in room 3 and the patient's care was started at 12:22 PM.    Patient ID: Andrea Morrow, female    DOB: May 20, 1951, 64 y.o.   MRN: [id number] Chief Complaint  Patient presents with  . Cough    1 week  . Medication Refill    hydrodiuril,zocor    HPI  HPI Comments: Andrea Morrow is a 64 y.o. female who presents to the [facility] for medication refill and a cough.    Cough:  Patient complains of a cough onset one week ago.  She notes that it was productive (clear mucous) momentarily yesterday and states that she has coughing fits at times. Her coughs have gotten better since it started a week ago but the fits are still persisting.  She has associated symptoms of fatigue and notes of slight irritation when she swallows/heart burn (noticed it half a dozen times).   She has been taking Mucin ex DM.  Denies fever, shortness of breath, congestion or chest pain.    Hypertension: She has been checking her blood pressure at home and states that it has been stable.  Denies chest pain and difficulty breathing or any side effects from her medication.   ------- She was controlled at last visit and was continued on same medication.   Lab Results  Component Value Date   CREATININE 0.85 09/27/2014    Hyperlipidemia: Patient has a piece of toast and half a cup of coffee this morning and is willing to come in on a day when she is fasting to check her cholesterol.  She has not changed her diet since her last visit.  She is currently not exercising nor has she started but is aware that she needs to begin.  Patient has been having a rough year with having a lot of family members pass so she has not had much time to work out.  ------ Recommended changing to [employer] based on last lipids.  Appears she has remained on Zocor.   Wt Readings from Last  3 Encounters:  06/21/15 166 lb 12.8 oz (75.66 kg)  09/27/14 166 lb 9.6 oz (75.569 kg)  11/16/13 167 lb 12.8 oz (76.114 kg)   Lab Results  Component Value Date   CHOL 251* 09/27/2014   HDL 82 09/27/2014   LDLCALC 148* 09/27/2014   TRIG 107 09/27/2014   CHOLHDL 3.1 09/27/2014   Lab Results  Component Value Date   ALT 21 09/27/2014   AST 25 09/27/2014   ALKPHOS 57 09/27/2014   BILITOT 0.6 09/27/2014     Patient Active Problem List   Diagnosis Date Noted  . Other screening mammogram 01/28/2014   Past Medical History  Diagnosis Date  . Hypertension   . Hyperlipidemia    Past Surgical History  Procedure Laterality Date  . Tubal ligation     No Known Allergies Prior to Admission medications   Medication Sig Start Date End Date Taking? Authorizing Provider  hydrochlorothiazide (HYDRODIURIL) 25 MG tablet Take 1 tablet (25 mg total) by mouth daily. 09/27/14  Yes [name], MD  Multiple Vitamins-Calcium (ONE-A-DAY WOMENS PO) Take by mouth.   Yes Historical Provider, MD  simvastatin (ZOCOR) 40 MG tablet Take 1 tablet (40 mg total) by mouth daily. 09/27/14  Yes [name], MD   Social History   Social  History  . Marital Status: Married    Spouse Name: N/A  . Number of Children: N/A  . Years of Education: N/A   Occupational History  . Not on file.   Social History Main Topics  . Smoking status: Never Smoker   . Smokeless tobacco: Not on file  . Alcohol Use: Yes  . Drug Use: No  . Sexual Activity: Not on file   Other Topics Concern  . Not on file   Social History Narrative   Married   Clinical research associate       Review of Systems  Constitutional: Positive for fatigue. Negative for unexpected weight change.  HENT: Negative for congestion.   Respiratory: Positive for cough. Negative for chest tightness and shortness of breath.   Cardiovascular: Negative for chest pain, palpitations and leg swelling.  Gastrointestinal: Negative for abdominal pain and blood  in stool.  Neurological: Negative for dizziness, syncope, light-headedness and headaches.       Objective:   Physical Exam  Constitutional: She is oriented to person, place, and time. She appears well-developed and well-nourished.  HENT:  Head: Normocephalic and atraumatic.  Eyes: Conjunctivae and EOM are normal. Pupils are equal, round, and reactive to light.  Neck: Carotid bruit is not present.  Cardiovascular: Normal rate, regular rhythm, normal heart sounds and intact distal pulses.   Pulmonary/Chest: Effort normal and breath sounds normal. No respiratory distress. She has no wheezes. She has no rales.  Abdominal: Soft. She exhibits no pulsatile midline mass. There is no tenderness.  Musculoskeletal:  No lower extremity edema.   Neurological: She is alert and oriented to person, place, and time.  Skin: Skin is warm and dry.  Psychiatric: She has a normal mood and affect. Her behavior is normal.  Vitals reviewed.  Filed Vitals:   06/21/15 1121  BP: 138/88  Pulse: 74  Temp: 97.4 F (36.3 C)  TempSrc: Oral  Resp: 16  Height: 5' 2.25" (1.581 m)  Weight: 166 lb 12.8 oz (75.66 kg)  SpO2: 98%       Assessment & Plan:   Andrea Morrow is a 65 y.o. female Hyperlipemia - Plan: COMPLETE METABOLIC PANEL WITH GFR, Lipid panel, simvastatin (ZOCOR) 40 MG tablet  Due to cost, continue Zocor at current dose, but would consider change to Crestor. Coupon and voucher given to her to check with her pharmacy on cost, and if this were feasible, may need to change this once her receive her labs.  Essential hypertension - Plan: hydrochlorothiazide (HYDRODIURIL) 25 MG tablet  -Borderline, but overall controlled. Continue HCTZ 25 mg daily.  Heartburn, Cough - Plan: benzonatate (TESSALON) 100 MG capsule  -Probable resolving URI versus component of laryngeal pharyngeal reflux. Zantac over-the-counter, avoid burn triggers, and Tessalon as needed.   Lab orders given for fasting labs to be drawn  the next week.    Meds ordered this encounter  Medications  . benzonatate (TESSALON) 100 MG capsule    Sig: Take 1 capsule (100 mg total) by mouth 3 (three) times daily as needed for cough.    Dispense:  20 capsule    Refill:  0  . simvastatin (ZOCOR) 40 MG tablet    Sig: Take 1 tablet (40 mg total) by mouth daily.    Dispense:  90 tablet    Refill:  1  . hydrochlorothiazide (HYDRODIURIL) 25 MG tablet    Sig: Take 1 tablet (25 mg total) by mouth daily.    Dispense:  90 tablet  Refill:  1   Patient Instructions  Return in the next few days if possible to have your bloodwork drawn. This should be fasting so nothing to eat for 8 hours prior. You should receive a call or letter about your lab results within the next week to 10 days.   I refilled the  Zocor at same dose for now, but based on your last cholesterol test you may need to be on a stronger med like Crestor. Check with your pharmacist about the cost of Crestor including with the coupons that were given to you today to see if this would be an option if cholesterol tests are still elevated.  Same dose of HCTZ for your blood pressure, but start exercise as we discussed, walking to begin with, most days of the week.  For your cough, this could be due to a virus or upper respiratory infection that is improving, but with the episodic heartburn, this can also cause cough. Try Zantac over-the-counter for the next week or two, and Tessalon Perles if needed for cough. If the cough is not improving into next week, call me and we can possibly send in an antibiotic, but it does not appear to be a bacterial infection at this time.  Return to the clinic or go to the nearest emergency room if any of your symptoms worsen or new symptoms occur.  Cough, Adult  A cough is a reflex that helps clear your throat and airways. It can help heal the body or may be a reaction to an irritated airway. A cough may only last 2 or 3 weeks (acute) or may last  more than 8 weeks (chronic).  CAUSES Acute cough:  Viral or bacterial infections. Chronic cough:  Infections.  Allergies.  Asthma.  Post-nasal drip.  Smoking.  Heartburn or acid reflux.  Some medicines.  Chronic lung problems (COPD).  Cancer. SYMPTOMS   Cough.  Fever.  Chest pain.  Increased breathing rate.  High-pitched whistling sound when breathing (wheezing).  Colored mucus that you cough up (sputum). TREATMENT   A bacterial cough may be treated with antibiotic medicine.  A viral cough must run its course and will not respond to antibiotics.  Your caregiver may recommend other treatments if you have a chronic cough. HOME CARE INSTRUCTIONS   Only take over-the-counter or prescription medicines for pain, discomfort, or fever as directed by your caregiver. Use cough suppressants only as directed by your caregiver.  Use a cold steam vaporizer or humidifier in your bedroom or home to help loosen secretions.  Sleep in a semi-upright position if your cough is worse at night.  Rest as needed.  Stop smoking if you smoke. SEEK IMMEDIATE MEDICAL CARE IF:   You have pus in your sputum.  Your cough starts to worsen.  You cannot control your cough with suppressants and are losing sleep.  You begin coughing up blood.  You have difficulty breathing.  You develop pain which is getting worse or is uncontrolled with medicine.  You have a fever. MAKE SURE YOU:   Understand these instructions.  Will watch your condition.  Will get help right away if you are not doing well or get worse. Document Released: 03/09/2011 Document Revised: 12/03/2011 Document Reviewed: 03/09/2011 [facility] Patient Information 2015 [place], [place]. This information is not intended to replace advice given to you by your health care provider. Make sure you discuss any questions you have with your health care provider.  Food Choices for Gastroesophageal Reflux Disease When  you have  gastroesophageal reflux disease (GERD), the foods you eat and your eating habits are very important. Choosing the right foods can help ease the discomfort of GERD. WHAT GENERAL GUIDELINES DO I NEED TO FOLLOW?  Choose fruits, vegetables, whole grains, low-fat dairy products, and low-fat meat, fish, and poultry.  Limit fats such as oils, salad dressings, butter, nuts, and avocado.  Keep a food diary to identify foods that cause symptoms.  Avoid foods that cause reflux. These may be different for different people.  Eat frequent small meals instead of three large meals each day.  Eat your meals slowly, in a relaxed setting.  Limit fried foods.  Cook foods using methods other than frying.  Avoid drinking alcohol.  Avoid drinking large amounts of liquids with your meals.  Avoid bending over or lying down until 2-3 hours after eating. WHAT FOODS ARE NOT RECOMMENDED? The following are some foods and drinks that may worsen your symptoms: Vegetables Tomatoes. Tomato juice. Tomato and spaghetti sauce. Chili peppers. Onion and garlic. Horseradish. Fruits Oranges, grapefruit, and lemon (fruit and juice). Meats High-fat meats, fish, and poultry. This includes hot dogs, ribs, ham, sausage, salami, and bacon. Dairy Whole milk and chocolate milk. Sour cream. Cream. Butter. Ice cream. Cream cheese.  Beverages Coffee and tea, with or without caffeine. Carbonated beverages or energy drinks. Condiments Hot sauce. Barbecue sauce.  Sweets/Desserts Chocolate and cocoa. Donuts. Peppermint and spearmint. Fats and Oils High-fat foods, including [place] fries and potato chips. Other Vinegar. Strong spices, such as black pepper, white pepper, red pepper, cayenne, curry powder, cloves, ginger, and chili powder. The items listed above may not be a complete list of foods and beverages to avoid. Contact your dietitian for more information. Document Released: 09/10/2005 Document Revised: 09/15/2013  Document Reviewed: 07/15/2013 [facility] Patient Information 2015 [place], [place]. This information is not intended to replace advice given to you by your health care provider. Make sure you discuss any questions you have with your health care provider.     I personally performed the services described in this documentation, which was scribed in my presence. The recorded information has been reviewed and considered, and addended by me as needed.

## 2015-06-21 NOTE — Patient Instructions (Addendum)
Return in the next few days if possible to have your bloodwork drawn. This should be fasting so nothing to eat for 8 hours prior. You should receive a call or letter about your lab results within the next week to 10 days.   I refilled the  Zocor at same dose for now, but based on your last cholesterol test you may need to be on a stronger med like Crestor. Check with your pharmacist about the cost of Crestor including with the coupons that were given to you today to see if this would be an option if cholesterol tests are still elevated.  Same dose of HCTZ for your blood pressure, but start exercise as we discussed, walking to begin with, most days of the week.  For your cough, this could be due to a virus or upper respiratory infection that is improving, but with the episodic heartburn, this can also cause cough. Try Zantac over-the-counter for the next week or two, and Tessalon Perles if needed for cough. If the cough is not improving into next week, call me and we can possibly send in an antibiotic, but it does not appear to be a bacterial infection at this time.  Return to the clinic or go to the nearest emergency room if any of your symptoms worsen or new symptoms occur.  Cough, Adult  A cough is a reflex that helps clear your throat and airways. It can help heal the body or may be a reaction to an irritated airway. A cough may only last 2 or 3 weeks (acute) or may last more than 8 weeks (chronic).  CAUSES Acute cough:  Viral or bacterial infections. Chronic cough:  Infections.  Allergies.  Asthma.  Post-nasal drip.  Smoking.  Heartburn or acid reflux.  Some medicines.  Chronic lung problems (COPD).  Cancer. SYMPTOMS   Cough.  Fever.  Chest pain.  Increased breathing rate.  High-pitched whistling sound when breathing (wheezing).  Colored mucus that you cough up (sputum). TREATMENT   A bacterial cough may be treated with antibiotic medicine.  A viral cough must  run its course and will not respond to antibiotics.  Your caregiver may recommend other treatments if you have a chronic cough. HOME CARE INSTRUCTIONS   Only take over-the-counter or prescription medicines for pain, discomfort, or fever as directed by your caregiver. Use cough suppressants only as directed by your caregiver.  Use a cold steam vaporizer or humidifier in your bedroom or home to help loosen secretions.  Sleep in a semi-upright position if your cough is worse at night.  Rest as needed.  Stop smoking if you smoke. SEEK IMMEDIATE MEDICAL CARE IF:   You have pus in your sputum.  Your cough starts to worsen.  You cannot control your cough with suppressants and are losing sleep.  You begin coughing up blood.  You have difficulty breathing.  You develop pain which is getting worse or is uncontrolled with medicine.  You have a fever. MAKE SURE YOU:   Understand these instructions.  Will watch your condition.  Will get help right away if you are not doing well or get worse. Document Released: 03/09/2011 Document Revised: 12/03/2011 Document Reviewed: 03/09/2011 [facility] Patient Information 2015 [place], [place]. This information is not intended to replace advice given to you by your health care provider. Make sure you discuss any questions you have with your health care provider.  Food Choices for Gastroesophageal Reflux Disease When you have gastroesophageal reflux disease (GERD), the foods  you eat and your eating habits are very important. Choosing the right foods can help ease the discomfort of GERD. WHAT GENERAL GUIDELINES DO I NEED TO FOLLOW?  Choose fruits, vegetables, whole grains, low-fat dairy products, and low-fat meat, fish, and poultry.  Limit fats such as oils, salad dressings, butter, nuts, and avocado.  Keep a food diary to identify foods that cause symptoms.  Avoid foods that cause reflux. These may be different for different people.  Eat  frequent small meals instead of three large meals each day.  Eat your meals slowly, in a relaxed setting.  Limit fried foods.  Cook foods using methods other than frying.  Avoid drinking alcohol.  Avoid drinking large amounts of liquids with your meals.  Avoid bending over or lying down until 2-3 hours after eating. WHAT FOODS ARE NOT RECOMMENDED? The following are some foods and drinks that may worsen your symptoms: Vegetables Tomatoes. Tomato juice. Tomato and spaghetti sauce. Chili peppers. Onion and garlic. Horseradish. Fruits Oranges, grapefruit, and lemon (fruit and juice). Meats High-fat meats, fish, and poultry. This includes hot dogs, ribs, ham, sausage, salami, and bacon. Dairy Whole milk and chocolate milk. Sour cream. Cream. Butter. Ice cream. Cream cheese.  Beverages Coffee and tea, with or without caffeine. Carbonated beverages or energy drinks. Condiments Hot sauce. Barbecue sauce.  Sweets/Desserts Chocolate and cocoa. Donuts. Peppermint and spearmint. Fats and Oils High-fat foods, including [place] fries and potato chips. Other Vinegar. Strong spices, such as black pepper, white pepper, red pepper, cayenne, curry powder, cloves, ginger, and chili powder. The items listed above may not be a complete list of foods and beverages to avoid. Contact your dietitian for more information. Document Released: 09/10/2005 Document Revised: 09/15/2013 Document Reviewed: 07/15/2013 [facility] Patient Information 2015 [place], [place]. This information is not intended to replace advice given to you by your health care provider. Make sure you discuss any questions you have with your health care provider.

## 2015-06-22 ENCOUNTER — Other Ambulatory Visit: Payer: Self-pay | Admitting: Family Medicine

## 2015-06-22 DIAGNOSIS — E785 Hyperlipidemia, unspecified: Secondary | ICD-10-CM

## 2015-06-22 LAB — COMPLETE METABOLIC PANEL WITH GFR
ALBUMIN: 4 g/dL (ref 3.6–5.1)
ALK PHOS: 53 U/L (ref 33–130)
ALT: 13 U/L (ref 6–29)
AST: 16 U/L (ref 10–35)
BILIRUBIN TOTAL: 0.5 mg/dL (ref 0.2–1.2)
BUN: 15 mg/dL (ref 7–25)
CO2: 28 mmol/L (ref 20–31)
Calcium: 9.4 mg/dL (ref 8.6–10.4)
Chloride: 103 mmol/L (ref 98–110)
Creat: 0.81 mg/dL (ref 0.50–0.99)
GFR, Est African American: >89 mL/min (ref >=60)
GFR, Est Non African American: 78 mL/min (ref >=60)
Glucose, Bld: 96 mg/dL (ref 65–99)
Potassium: 4.4 mmol/L (ref 3.5–5.3)
Sodium: 140 mmol/L (ref 135–146)
TOTAL PROTEIN: 6.8 g/dL (ref 6.1–8.1)

## 2015-06-22 LAB — LIPID PANEL
CHOLESTEROL: 203 mg/dL — AB (ref 125–200)
HDL: 73 mg/dL (ref >=46)
LDL Cholesterol: 107 mg/dL (ref <130)
Total CHOL/HDL Ratio: 2.8 Ratio (ref <=5.0)
Triglycerides: 116 mg/dL (ref <150)
VLDL: 23 mg/dL (ref <30)

## 2015-07-24 ENCOUNTER — Encounter (HOSPITAL_COMMUNITY): Payer: Self-pay | Admitting: *Deleted

## 2015-07-24 ENCOUNTER — Emergency Department (HOSPITAL_COMMUNITY): Payer: No Typology Code available for payment source

## 2015-07-24 ENCOUNTER — Emergency Department (HOSPITAL_COMMUNITY)
Admission: EM | Admit: 2015-07-24 | Discharge: 2015-07-24 | Disposition: A | Discharge status: Home / Self Care | Payer: No Typology Code available for payment source | Attending: Emergency Medicine | Admitting: Emergency Medicine

## 2015-07-24 DIAGNOSIS — E785 Hyperlipidemia, unspecified: Secondary | ICD-10-CM | POA: Diagnosis not present

## 2015-07-24 DIAGNOSIS — Y9389 Activity, other specified: Secondary | ICD-10-CM | POA: Insufficient documentation

## 2015-07-24 DIAGNOSIS — Y92410 Unspecified street and highway as the place of occurrence of the external cause: Secondary | ICD-10-CM | POA: Diagnosis not present

## 2015-07-24 DIAGNOSIS — Y998 Other external cause status: Secondary | ICD-10-CM | POA: Insufficient documentation

## 2015-07-24 DIAGNOSIS — Z79899 Other long term (current) drug therapy: Secondary | ICD-10-CM | POA: Insufficient documentation

## 2015-07-24 DIAGNOSIS — S4992XA Unspecified injury of left shoulder and upper arm, initial encounter: Secondary | ICD-10-CM | POA: Insufficient documentation

## 2015-07-24 DIAGNOSIS — I10 Essential (primary) hypertension: Secondary | ICD-10-CM | POA: Insufficient documentation

## 2015-07-24 DIAGNOSIS — M25512 Pain in left shoulder: Secondary | ICD-10-CM

## 2015-07-24 MED ORDER — ACETAMINOPHEN 325 MG PO TABS
650.0000 mg | ORAL_TABLET | Freq: Once | ORAL | Status: AC
  Administered 2015-07-24: 650 mg via ORAL
  Filled 2015-07-24: qty 2

## 2015-07-24 MED ORDER — ACETAMINOPHEN 500 MG PO TABS: 500.0000 mg | ORAL_TABLET | Freq: Four times a day (QID) | ORAL | Status: DC | PRN

## 2015-07-24 MED ORDER — CYCLOBENZAPRINE HCL 5 MG PO TABS: 5.0000 mg | ORAL_TABLET | Freq: Three times a day (TID) | ORAL | Status: DC | PRN
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
## 2015-07-24 NOTE — ED Notes (Signed)
Declined W/C at D/C and was escorted to lobby by RN. 

## 2015-07-24 NOTE — ED Notes (Signed)
AS pt was going to  X-ray , Pt reported she has numbness to feet. This information was reported to PA.
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
## 2015-08-09 ENCOUNTER — Ambulatory Visit (INDEPENDENT_AMBULATORY_CARE_PROVIDER_SITE_OTHER): Payer: Self-pay

## 2015-08-09 ENCOUNTER — Ambulatory Visit (INDEPENDENT_AMBULATORY_CARE_PROVIDER_SITE_OTHER): Payer: Self-pay | Admitting: Family Medicine

## 2015-08-09 VITALS — BP 124/80 | HR 115 | Temp 98.3°F | Resp 16 | Ht 61.0 in | Wt 166.0 lb

## 2015-08-09 DIAGNOSIS — S46012A Strain of muscle(s) and tendon(s) of the rotator cuff of left shoulder, initial encounter: Secondary | ICD-10-CM

## 2015-08-09 DIAGNOSIS — M79645 Pain in left finger(s): Secondary | ICD-10-CM

## 2015-08-09 DIAGNOSIS — M25512 Pain in left shoulder: Secondary | ICD-10-CM

## 2015-08-09 DIAGNOSIS — M542 Cervicalgia: Secondary | ICD-10-CM

## 2015-08-09 MED ORDER — MELOXICAM 7.5 MG PO TABS: 7.5000 mg | ORAL_TABLET | Freq: Every day | ORAL | Status: DC

## 2015-08-09 MED ORDER — CYCLOBENZAPRINE HCL 5 MG PO TABS: ORAL_TABLET | ORAL | Status: DC
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
## 2015-08-09 NOTE — Patient Instructions (Signed)
I will refer you to ortho for your shoulder.You likely have a sprained ligament or strained muscle in the neck, which can lead to some muscle spasm as well, or pinched nerve into your arms.  Try the mobic each morning (do not combine with other over the counter pain relievers), flexeril at night if needed. Range of motion as needed.   Return to the clinic or go to the nearest emergency room if any of your symptoms worsen or new symptoms occur.

## 2015-12-21 ENCOUNTER — Other Ambulatory Visit: Payer: Self-pay | Admitting: Family Medicine

## 2016-03-19 ENCOUNTER — Other Ambulatory Visit: Payer: Self-pay | Admitting: Family Medicine

## 2016-03-30 ENCOUNTER — Ambulatory Visit (INDEPENDENT_AMBULATORY_CARE_PROVIDER_SITE_OTHER): Payer: Self-pay | Admitting: Family Medicine

## 2016-03-30 VITALS — BP 126/82 | HR 84 | Temp 98.1°F | Resp 18 | Ht 61.0 in | Wt 170.0 lb

## 2016-03-30 DIAGNOSIS — E785 Hyperlipidemia, unspecified: Secondary | ICD-10-CM | POA: Insufficient documentation

## 2016-03-30 DIAGNOSIS — I10 Essential (primary) hypertension: Secondary | ICD-10-CM

## 2016-03-30 MED ORDER — HYDROCHLOROTHIAZIDE 25 MG PO TABS: 25.0000 mg | ORAL_TABLET | Freq: Every day | ORAL | Status: DC

## 2016-03-30 MED ORDER — SIMVASTATIN 40 MG PO TABS: 40.0000 mg | ORAL_TABLET | Freq: Every day | ORAL | Status: DC

## 2016-03-30 NOTE — Patient Instructions (Addendum)
     IF you received an x-ray today, you will receive an invoice from [facility]. Please contact [facility] at [phone] with questions or concerns regarding your invoice.   IF you received labwork today, you will receive an invoice from [employer]. Please contact [facility] at [phone] with questions or concerns regarding your invoice.   Our billing staff will not be able to assist you with questions regarding bills from these companies.  You will be contacted with the lab results as soon as they are available. The fastest way to get your results is to activate your My Chart account. Instructions are located on the last page of this paperwork. If you have not heard from [place] regarding the results in 2 weeks, please contact this office.    We recommend that you schedule a mammogram for breast cancer screening. Typically, you do not need a referral to do this. Please contact a local imaging center to schedule your mammogram.  [facility] - [phone]  *ask for the Radiology [place] - [phone] or [phone]  [facility] - [phone] [place] [phone] [facility] - [phone]  *ask for the [facility] - [phone]  *ask for the Radiology Department [facility] - [phone]  *ask for the [place] - [phone]   Follow-up in the next 6-9 months for physical. Continue same medications for now, will let you know when we have the lab results.

## 2016-03-30 NOTE — Progress Notes (Signed)
By signing my name below, I, Mesha Guinyard, attest that this documentation has been prepared under the direction and in the presence of [name], MD.  Electronically Signed: Verlee Monte, Medical Scribe. 03/30/2016. 4:48 PM.  Subjective:    Patient ID: Andrea Morrow, female    DOB: 1950/10/02, 65 y.o.   MRN: [id number]  HPI Chief Complaint  Patient presents with  . Medication Refill    simvastatin,hydrodiuril    HPI Comments: Andrea Morrow is a 65 y.o. female who presents to the [facility] for medication refills. Pt's last meal was Cheerios at 10 am this morning.  HLD: Pt is taking Zocor 40 mg daily- pt ran out a couple of days ago. Lab Results  Component Value Date   CHOL 203* 06/22/2015   HDL 73 06/22/2015   LDLCALC 107 06/22/2015   TRIG 116 06/22/2015   CHOLHDL 2.8 06/22/2015   Lab Results  Component Value Date   ALT 13 06/22/2015   AST 16 06/22/2015   ALKPHOS 53 06/22/2015   BILITOT 0.5 06/22/2015   HTN: Pt is taking HCTZ 25 mg daily- pt ran out a couple of days ago. Pt denies experiencing any side affects on her medication. Pt wasn't checking her blood pressure often, but when she did they ran in the high 120s and low 130s.  Health Maintenance: Pt doesn't exercise, although she knows she should. Pt will park out further from stores in the parking lot for some exercise. Pt denies chest pain, SOB with exertion. Nl mammogram 1 month ago.  Patient Active Problem List   Diagnosis Date Noted  . Hyperlipidemia 03/30/2016  . Essential hypertension 03/30/2016  . Other screening mammogram 01/28/2014   Past Medical History  Diagnosis Date  . Hypertension   . Hyperlipidemia    Past Surgical History  Procedure Laterality Date  . Tubal ligation     No Known Allergies Prior to Admission medications   Medication Sig Start Date End Date Taking? Authorizing Provider  hydrochlorothiazide (HYDRODIURIL) 25 MG tablet TAKE 1 TABLET BY MOUTH DAILY  12/21/15  Yes [name], MD  Multiple Vitamins-Calcium (ONE-A-DAY WOMENS PO) Take by mouth.   Yes Historical Provider, MD  simvastatin (ZOCOR) 40 MG tablet TAKE 1 TABLET BY MOUTH DAILY 12/21/15  Yes [name], MD  acetaminophen (TYLENOL) 500 MG tablet Take 1 tablet (500 mg total) by mouth every 6 (six) hours as needed. Patient not taking: Reported on 03/30/2016 07/24/15   Gloriann Loan, PA-C  aspirin EC 81 MG tablet Take 81 mg by mouth daily. Reported on 03/30/2016    Historical Provider, MD  cyclobenzaprine (FLEXERIL) 5 MG tablet 1 pill by mouth up to every 8 hours as needed. Start with one pill by mouth each bedtime as needed due to sedation Patient not taking: Reported on 03/30/2016 08/09/15   [name], MD  meloxicam (MOBIC) 7.5 MG tablet Take 1 tablet (7.5 mg total) by mouth daily. Patient not taking: Reported on 03/30/2016 08/09/15   [name], MD   Social History   Social History  . Marital Status: Married    Spouse Name: N/A  . Number of Children: N/A  . Years of Education: N/A   Occupational History  . Not on file.   Social History Main Topics  . Smoking status: Never Smoker   . Smokeless tobacco: Not on file  . Alcohol Use: Yes  . Drug Use: No  . Sexual Activity: Not on file  Other Topics Concern  . Not on file   Social History Narrative   Married   Clinical research associate      Review of Systems  Constitutional: Negative for fatigue and unexpected weight change.  Respiratory: Negative for chest tightness and shortness of breath.   Cardiovascular: Negative for chest pain, palpitations and leg swelling.  Gastrointestinal: Negative for abdominal pain and blood in stool.  Musculoskeletal: Negative for myalgias and arthralgias.  Neurological: Negative for dizziness, syncope, light-headedness and headaches.   Objective:  BP 126/82 mmHg  Pulse 84  Temp(Src) 98.1 F (36.7 C) (Oral)  Resp 18  Ht 5\' 1"  (1.549 m)  Wt 170 lb (77.111 kg)  BMI 32.14 kg/m2   SpO2 98%  Physical Exam  Constitutional: She is oriented to person, place, and time. She appears well-developed and well-nourished.  HENT:  Head: Normocephalic and atraumatic.  Eyes: Conjunctivae and EOM are normal. Pupils are equal, round, and reactive to light.  Neck: Carotid bruit is not present.  Cardiovascular: Normal rate, regular rhythm, normal heart sounds and intact distal pulses.   Pulmonary/Chest: Effort normal and breath sounds normal. No respiratory distress. She has no wheezes. She has no rales.  Abdominal: Soft. She exhibits no pulsatile midline mass. There is no tenderness.  Musculoskeletal: She exhibits no edema.  Neurological: She is alert and oriented to person, place, and time.  Skin: Skin is warm and dry.  Psychiatric: She has a normal mood and affect. Her behavior is normal.  Vitals reviewed.  Assessment & Plan:   Andrea Morrow is a 65 y.o. female Hyperlipidemia - Plan: COMPLETE METABOLIC PANEL WITH GFR, Lipid panel, simvastatin (ZOCOR) 40 MG tablet  -Tolerating Zocor at current dose. Most recent lipid panel looked okay on Zocor. Repeat lipids, CMP, continue Zocor 40 mg daily.  Essential hypertension - Plan: COMPLETE METABOLIC PANEL WITH GFR, hydrochlorothiazide (HYDRODIURIL) 25 MG tablet  -Stable on current doses of HCTZ. Check CMP, return for physical in the next 6-9 months. Increase in activity/exercise was discussed to manage both blood pressure and hyperlipidemia.  Meds ordered this encounter  Medications  . hydrochlorothiazide (HYDRODIURIL) 25 MG tablet    Sig: Take 1 tablet (25 mg total) by mouth daily.    Dispense:  90 tablet    Refill:  2  . simvastatin (ZOCOR) 40 MG tablet    Sig: Take 1 tablet (40 mg total) by mouth daily.    Dispense:  90 tablet    Refill:  2   Patient Instructions       IF you received an x-ray today, you will receive an invoice from [facility]. Please contact [facility] at [phone] with questions or  concerns regarding your invoice.   IF you received labwork today, you will receive an invoice from [employer]. Please contact [facility] at [phone] with questions or concerns regarding your invoice.   Our billing staff will not be able to assist you with questions regarding bills from these companies.  You will be contacted with the lab results as soon as they are available. The fastest way to get your results is to activate your My Chart account. Instructions are located on the last page of this paperwork. If you have not heard from [place] regarding the results in 2 weeks, please contact this office.    We recommend that you schedule a mammogram for breast cancer screening. Typically, you do not need a referral to do this. Please contact a local imaging center  to schedule your mammogram.  [facility] - [phone]  *ask for the Radiology [place] ([place]) - [phone] or [phone]  [facility] - [phone] [place] [phone] [facility] - [phone]  *ask for the [facility] - [phone]  *ask for the Radiology Department [facility] - [phone]  *ask for the [place] - [phone]   Follow-up in the next 6-9 months for physical. Continue same medications for now, will let you know when we have the lab results.     I personally performed the services described in this documentation, which was scribed in my presence. The recorded information has been reviewed and considered, and addended by me as needed.   Signed,   [name], MD [facility].  03/31/2016 11:30 AM

## 2016-03-31 LAB — LIPID PANEL
CHOLESTEROL: 270 mg/dL — AB (ref 125–200)
HDL: 80 mg/dL (ref >=46)
LDL CALC: 165 mg/dL — AB (ref <130)
Total CHOL/HDL Ratio: 3.4 Ratio (ref <=5.0)
Triglycerides: 127 mg/dL (ref <150)
VLDL: 25 mg/dL (ref <30)

## 2016-03-31 LAB — COMPLETE METABOLIC PANEL WITH GFR
ALT: 18 U/L (ref 6–29)
AST: 23 U/L (ref 10–35)
Albumin: 4.5 g/dL (ref 3.6–5.1)
Alkaline Phosphatase: 51 U/L (ref 33–130)
BUN: 15 mg/dL (ref 7–25)
CALCIUM: 10.1 mg/dL (ref 8.6–10.4)
CHLORIDE: 103 mmol/L (ref 98–110)
CO2: 24 mmol/L (ref 20–31)
CREATININE: 0.95 mg/dL (ref 0.50–0.99)
GFR, Est African American: 74 mL/min (ref >=60)
GFR, Est Non African American: 64 mL/min (ref >=60)
Glucose, Bld: 96 mg/dL (ref 65–99)
Potassium: 4.1 mmol/L (ref 3.5–5.3)
Sodium: 141 mmol/L (ref 135–146)
Total Bilirubin: 0.5 mg/dL (ref 0.2–1.2)
Total Protein: 7.4 g/dL (ref 6.1–8.1)

## 2016-06-27 ENCOUNTER — Telehealth: Payer: Self-pay

## 2016-06-27 MED ORDER — ROSUVASTATIN CALCIUM 10 MG PO TABS: 10.0000 mg | ORAL_TABLET | Freq: Every day | ORAL | 0 refills | Status: DC

## 2016-06-27 NOTE — Telephone Encounter (Signed)
In review of the most recent recommendations, it is not recommended to go above 40 mg of Zocor. We can try Crestor instead starting at 10 mg once a day. Return in 6-8 weeks to recheck cholesterol and liver tests on that medication. Let me know if she has any other questions.

## 2016-06-27 NOTE — Telephone Encounter (Signed)
See lab result and note and advise what your suggestion would be with rx.

## 2016-06-27 NOTE — Telephone Encounter (Signed)
PATIENT STATES SHE GOT HER LAB RESULTS FROM HER RECENT VISIT AND DR. GREENE WANTED TO KNOW IF SHE WOULD LIKE TO INCREASE HER ZOCOR OR IF SHE WOULD LIKE TO TRY CRESTOR OR LIPITOR. SHE WANTS TO KNOW WHAT DR. GREENE THINKS IS BEST FOR HER? BEST PHONE [phone] (CELL)  PHARMACY CHOICE IS [facility].  [place]
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
## 2016-07-04 NOTE — Telephone Encounter (Signed)
Spoke to patient and advised her of Dr. Vonna Kotyk message.  She said that she will RTC during advised timeframe to have bloodwork to see Dr. Carlota Raspberry for labs.

## 2016-07-23 ENCOUNTER — Ambulatory Visit (INDEPENDENT_AMBULATORY_CARE_PROVIDER_SITE_OTHER): Payer: Self-pay | Admitting: Family Medicine

## 2016-07-23 VITALS — BP 122/72 | HR 99 | Temp 98.2°F | Resp 17 | Ht 62.5 in | Wt 169.0 lb

## 2016-07-23 DIAGNOSIS — J209 Acute bronchitis, unspecified: Secondary | ICD-10-CM

## 2016-07-23 MED ORDER — BENZONATATE 100 MG PO CAPS: 100.0000 mg | ORAL_CAPSULE | Freq: Two times a day (BID) | ORAL | 0 refills | Status: DC | PRN

## 2016-07-23 MED ORDER — AZITHROMYCIN 250 MG PO TABS: ORAL_TABLET | ORAL | 0 refills | Status: DC

## 2016-07-23 MED ORDER — ALBUTEROL SULFATE HFA 108 (90 BASE) MCG/ACT IN AERS: 2.0000 | INHALATION_SPRAY | Freq: Four times a day (QID) | RESPIRATORY_TRACT | 0 refills | Status: DC | PRN

## 2016-07-23 MED ORDER — PREDNISONE 20 MG PO TABS: 40.0000 mg | ORAL_TABLET | Freq: Every day | ORAL | 0 refills | Status: AC

## 2016-07-23 NOTE — Patient Instructions (Addendum)
IF you received an x-ray today, you will receive an invoice from [facility]. Please contact [facility] at [phone] with questions or concerns regarding your invoice.   IF you received labwork today, you will receive an invoice from [employer]. Please contact [facility] at [phone] with questions or concerns regarding your invoice.   Our billing staff will not be able to assist you with questions regarding bills from these companies.  You will be contacted with the lab results as soon as they are available. The fastest way to get your results is to activate your My Chart account. Instructions are located on the last page of this paperwork. If you have not heard from [place] regarding the results in 2 weeks, please contact this office.     We recommend that you schedule a mammogram for breast cancer screening. Typically, you do not need a referral to do this. Please contact a local imaging center to schedule your mammogram.  [facility] - [phone]  *ask for the Radiology Department [facility] ([place]) - [phone] or [phone]  [facility] - [phone] [place] [phone] [facility] - [phone]  *ask for the [facility] - [phone]  *ask for the Radiology Department [facility] - [phone]  *ask for the Mammography Department [facility] - [phone]  Acute Bronchitis Bronchitis is inflammation of the airways that extend from the windpipe into the lungs (bronchi). The inflammation often causes mucus to develop. This leads to a cough, which is the most common symptom of bronchitis.  In acute bronchitis, the condition usually develops suddenly and goes away over time, usually in a couple weeks. Smoking, allergies, and asthma can make bronchitis worse. Repeated episodes of bronchitis may  cause further lung problems.  CAUSES Acute bronchitis is most often caused by the same virus that causes a cold. The virus can spread from person to person (contagious) through coughing, sneezing, and touching contaminated objects. SIGNS AND SYMPTOMS   Cough.   Fever.   Coughing up mucus.   Body aches.   Chest congestion.   Chills.   Shortness of breath.   Sore throat.  DIAGNOSIS  Acute bronchitis is usually diagnosed through a physical exam. Your health care provider will also ask you questions about your medical history. Tests, such as chest X-rays, are sometimes done to rule out other conditions.  TREATMENT  Acute bronchitis usually goes away in a couple weeks. Oftentimes, no medical treatment is necessary. Medicines are sometimes given for relief of fever or cough. Antibiotic medicines are usually not needed but may be prescribed in certain situations. In some cases, an inhaler may be recommended to help reduce shortness of breath and control the cough. A cool mist vaporizer may also be used to help thin bronchial secretions and make it easier to clear the chest.  HOME CARE INSTRUCTIONS  Get plenty of rest.   Drink enough fluids to keep your urine clear or pale yellow (unless you have a medical condition that requires fluid restriction). Increasing fluids may help thin your respiratory secretions (sputum) and reduce chest congestion, and it will prevent dehydration.   Take medicines only as directed by your health care provider.  If you were prescribed an antibiotic medicine, finish it all even if you start to feel better.  Avoid smoking and secondhand smoke. Exposure to cigarette smoke or irritating chemicals will make  bronchitis worse. If you are a smoker, consider using nicotine gum or skin patches to help control withdrawal symptoms. Quitting smoking will help your lungs heal faster.   Reduce the chances of another bout of acute bronchitis by washing your hands  frequently, avoiding people with cold symptoms, and trying not to touch your hands to your mouth, nose, or eyes.   Keep all follow-up visits as directed by your health care provider.  SEEK MEDICAL CARE IF: Your symptoms do not improve after 1 week of treatment.  SEEK IMMEDIATE MEDICAL CARE IF:  You develop an increased fever or chills.   You have chest pain.   You have severe shortness of breath.  You have bloody sputum.   You develop dehydration.  You faint or repeatedly feel like you are going to pass out.  You develop repeated vomiting.  You develop a severe headache. MAKE SURE YOU:   Understand these instructions.  Will watch your condition.  Will get help right away if you are not doing well or get worse.   This information is not intended to replace advice given to you by your health care provider. Make sure you discuss any questions you have with your health care provider.   Document Released: 10/18/2004 Document Revised: 10/01/2014 Document Reviewed: 03/03/2013 Elsevier Interactive Patient Education [employer].

## 2016-07-23 NOTE — Progress Notes (Signed)
Chief Complaint  Patient presents with  . Cough  . URI    HPI   Upper Respiratory Infection: Patient complains of symptoms of a URI, follow up on sinusitis. Symptoms include cough. Onset of symptoms was 4 days ago, unchanged since that time. She also c/o congestion, cough described as nonproductive, fever tactile with sweats for 2 nights and non productive cough for the past 4 days .  She is drinking plenty of fluids. Evaluation to date: none. Treatment to date: cough suppressants and decongestants. She is a nonsmoker No history of asthma She does not have seasonal allergies    Past Medical History:  Diagnosis Date  . Hyperlipidemia   . Hypertension     Current Outpatient Prescriptions  Medication Sig Dispense Refill  . aspirin EC 81 MG tablet Take 81 mg by mouth daily. Reported on 03/30/2016    . hydrochlorothiazide (HYDRODIURIL) 25 MG tablet Take 1 tablet (25 mg total) by mouth daily. 90 tablet 2  . Multiple Vitamins-Calcium (ONE-A-DAY WOMENS PO) Take by mouth.    . rosuvastatin (CRESTOR) 10 MG tablet Take 1 tablet (10 mg total) by mouth daily. 90 tablet 0  . azithromycin (ZITHROMAX) 250 MG tablet Take 2 tablets today and one tablet each day after 6 tablet 0  . benzonatate (TESSALON) 100 MG capsule Take 1 capsule (100 mg total) by mouth 2 (two) times daily as needed for cough. 20 capsule 0  . predniSONE (DELTASONE) 20 MG tablet Take 2 tablets (40 mg total) by mouth daily with breakfast. 10 tablet 0   No current facility-administered medications for this visit.     Allergies: No Known Allergies  Past Surgical History:  Procedure Laterality Date  . TUBAL LIGATION      Social History   Social History  . Marital status: Married    Spouse name: N/A  . Number of children: N/A  . Years of education: N/A   Social History Main Topics  . Smoking status: Never Smoker  . Smokeless tobacco: None  . Alcohol use Yes  . Drug use: No  . Sexual activity: Not Asked   Other  Topics Concern  . None   Social History Narrative   Married   Clinical research associate       ROS  Objective: Vitals:   07/23/16 1023  BP: 122/72  Pulse: 99  Resp: 17  Temp: 98.2 F (36.8 C)  TempSrc: Oral  SpO2: 94%  Weight: 169 lb (76.7 kg)  Height: 5' 2.5" (1.588 m)    Physical Exam General: alert, oriented, in NAD Head: normocephalic, atraumatic, no sinus tenderness Eyes: EOM intact, no scleral icterus or conjunctival injection Ears: TM clear bilaterally Throat: no pharyngeal exudate or erythema Lymph: no posterior auricular, submental or cervical lymph adenopathy Heart: normal rate, normal sinus rhythm, no murmurs Lungs: wheezing heard in the LLL, improved after coughing   Assessment and Plan Andrea Morrow was seen today for cough and uri.  Diagnoses and all orders for this visit:  Bronchospasm with bronchitis, acute- advised zpak and prednisone  Tessalon for cough suppression Continue  mucinex prn nyquil in the evenings for sleep -     azithromycin (ZITHROMAX) 250 MG tablet; Take 2 tablets today and one tablet each day after -     predniSONE (DELTASONE) 20 MG tablet; Take 2 tablets (40 mg total) by mouth daily with breakfast. -     benzonatate (TESSALON) 100 MG capsule; Take 1 capsule (100 mg total) by mouth 2 (two) times  daily as needed for cough. -     Discontinue: albuterol (PROVENTIL HFA;VENTOLIN HFA) 108 (90 Base) MCG/ACT inhaler; Inhale 2 puffs into the lungs every 6 (six) hours as needed for wheezing or shortness of breath.     [place]

## 2016-10-01 ENCOUNTER — Ambulatory Visit (INDEPENDENT_AMBULATORY_CARE_PROVIDER_SITE_OTHER): Payer: Self-pay | Admitting: Family Medicine

## 2016-10-01 VITALS — BP 126/92 | HR 81 | Temp 98.0°F | Resp 16 | Ht 61.5 in | Wt 164.6 lb

## 2016-10-01 DIAGNOSIS — Z1211 Encounter for screening for malignant neoplasm of colon: Secondary | ICD-10-CM

## 2016-10-01 DIAGNOSIS — Z0000 Encounter for general adult medical examination without abnormal findings: Secondary | ICD-10-CM

## 2016-10-01 DIAGNOSIS — I10 Essential (primary) hypertension: Secondary | ICD-10-CM

## 2016-10-01 DIAGNOSIS — Z23 Encounter for immunization: Secondary | ICD-10-CM

## 2016-10-01 DIAGNOSIS — E782 Mixed hyperlipidemia: Secondary | ICD-10-CM

## 2016-10-01 MED ORDER — ZOSTER VACCINE LIVE 19400 UNT/0.65ML ~~LOC~~ SUSR: 0.6500 mL | Freq: Once | SUBCUTANEOUS | 0 refills | Status: AC

## 2016-10-01 MED ORDER — ROSUVASTATIN CALCIUM 10 MG PO TABS: 10.0000 mg | ORAL_TABLET | Freq: Every day | ORAL | 2 refills | Status: DC

## 2016-10-01 MED ORDER — HYDROCHLOROTHIAZIDE 25 MG PO TABS: 25.0000 mg | ORAL_TABLET | Freq: Every day | ORAL | 2 refills | Status: DC

## 2016-10-01 NOTE — Patient Instructions (Addendum)
Get your flu shot as soon as possible.  Shingles vaccine given for administration at your pharmacy Have hearing screen done at [facility] if possible, or let me know if other resource needed.    avoid caffeine after lunch. exercise during day - not within 3 hours of bedtime. avoid late night meals.  bed for sleep or sex only. turn clock/alarm clock away so can't be seen from bed, if unable to fall asleep in what feels like 20-30 minutes get up and read book in dim light until tired again.    You can try the HCTZ in the morning to see if that lessens frequency of urinating at night.   Return to the clinic or go to the nearest emergency room if any of your symptoms worsen or new symptoms occur.  Keeping You Healthy  Get These Tests  Blood Pressure- Have your blood pressure checked by your healthcare provider at least once a year.  Normal blood pressure is 120/80.  Weight- Have your body mass index (BMI) calculated to screen for obesity.  BMI is a measure of body fat based on height and weight.  You can calculate your own BMI at [employer]  Cholesterol- Have your cholesterol checked every year.  Diabetes- Have your blood sugar checked every year if you have high blood pressure, high cholesterol, a family history of diabetes or if you are overweight.  Pap Test - Have a pap test every 1 to 5 years if you have been sexually active.  If you are older than 65 and recent pap tests have been normal you may not need additional pap tests.  In addition, if you have had a hysterectomy  for benign disease additional pap tests are not necessary.  Mammogram-Yearly mammograms are essential for early detection of breast cancer  Screening for Colon Cancer- Colonoscopy starting at age 67. Screening may begin sooner depending on your family history and other health conditions.  Follow up colonoscopy as directed by your Gastroenterologist.  Screening for Osteoporosis- Screening begins at age 20 with  bone density scanning, sooner if you are at higher risk for developing Osteoporosis.  Get these medicines  Calcium with Vitamin D- Your body requires 1200-1500 mg of Calcium a day and [phone] IU of Vitamin D a day.  You can only absorb 500 mg of Calcium at a time therefore Calcium must be taken in 2 or 3 separate doses throughout the day.  Hormones- Hormone therapy has been associated with increased risk for certain cancers and heart disease.  Talk to your healthcare provider about if you need relief from menopausal symptoms.  Aspirin- Ask your healthcare provider about taking Aspirin to prevent Heart Disease and Stroke.  Get these Immuniztions  Flu shot- Every fall  Pneumonia shot- Once after the age of 86; if you are younger ask your healthcare provider if you need a pneumonia shot.  Tetanus- Every ten years.  Zostavax- Once after the age of 94 to prevent shingles.  Take these steps  Don't smoke- Your healthcare provider can help you quit. For tips on how to quit, ask your healthcare provider or go to www.smokefree.gov or call 1-800 QUIT-NOW.  Be physically active- Exercise 5 days a week for a minimum of 30 minutes.  If you are not already physically active, start slow and gradually work up to 30 minutes of moderate physical activity.  Try walking, dancing, bike riding, swimming, etc.  Eat a healthy diet- Eat a variety of healthy foods such as fruits, vegetables,  whole grains, low fat milk, low fat cheeses, yogurt, lean meats, chicken, fish, eggs, dried beans, tofu, etc.  For more information go to www.thenutritionsource.org  Dental visit- Brush and floss teeth twice daily; visit your dentist twice a year.  Eye exam- Visit your Optometrist or Ophthalmologist yearly.  Drink alcohol in moderation- Limit alcohol intake to one drink or less a day.  Never drink and drive.  Depression- Your emotional health is as important as your physical health.  If you're feeling down or losing  interest in things you normally enjoy, please talk to your healthcare provider.  Seat Belts- can save your life; always wear one  Smoke/Carbon Monoxide detectors- These detectors need to be installed on the appropriate level of your home.  Replace batteries at least once a year.  Violence- If anyone is threatening or hurting you, please tell your healthcare provider.  Living Will/ Health care power of attorney- Discuss with your healthcare provider and family.  Insomnia Insomnia is a sleep disorder that makes it difficult to fall asleep or to stay asleep. Insomnia can cause tiredness (fatigue), low energy, difficulty concentrating, mood swings, and poor performance at work or school. There are three different ways to classify insomnia:  Difficulty falling asleep.  Difficulty staying asleep.  Waking up too early in the morning. Any type of insomnia can be long-term (chronic) or short-term (acute). Both are common. Short-term insomnia usually lasts for three months or less. Chronic insomnia occurs at least three times a week for longer than three months. What are the causes? Insomnia may be caused by another condition, situation, or substance, such as:  Anxiety.  Certain medicines.  Gastroesophageal reflux disease (GERD) or other gastrointestinal conditions.  Asthma or other breathing conditions.  Restless legs syndrome, sleep apnea, or other sleep disorders.  Chronic pain.  Menopause. This may include hot flashes.  Stroke.  Abuse of alcohol, tobacco, or illegal drugs.  Depression.  Caffeine.  Neurological disorders, such as Alzheimer disease.  An overactive thyroid (hyperthyroidism). The cause of insomnia may not be known. What increases the risk? Risk factors for insomnia include:  Gender. Women are more commonly affected than men.  Age. Insomnia is more common as you get older.  Stress. This may involve your professional or personal life.  Income. Insomnia  is more common in people with lower income.  Lack of exercise.  Irregular work schedule or night shifts.  Traveling between different time zones. What are the signs or symptoms? If you have insomnia, trouble falling asleep or trouble staying asleep is the main symptom. This may lead to other symptoms, such as:  Feeling fatigued.  Feeling nervous about going to sleep.  Not feeling rested in the morning.  Having trouble concentrating.  Feeling irritable, anxious, or depressed. How is this treated? Treatment for insomnia depends on the cause. If your insomnia is caused by an underlying condition, treatment will focus on addressing the condition. Treatment may also include:  Medicines to help you sleep.  Counseling or therapy.  Lifestyle adjustments. Follow these instructions at home:  Take medicines only as directed by your health care provider.  Keep regular sleeping and waking hours. Avoid naps.  Keep a sleep diary to help you and your health care provider figure out what could be causing your insomnia. Include:  When you sleep.  When you wake up during the night.  How well you sleep.  How rested you feel the next day.  Any side effects of medicines you are taking.  What you eat and drink.  Make your bedroom a comfortable place where it is easy to fall asleep:  Put up shades or special blackout curtains to block light from outside.  Use a white noise machine to block noise.  Keep the temperature cool.  Exercise regularly as directed by your health care provider. Avoid exercising right before bedtime.  Use relaxation techniques to manage stress. Ask your health care provider to suggest some techniques that may work well for you. These may include:  Breathing exercises.  Routines to release muscle tension.  Visualizing peaceful scenes.  Cut back on alcohol, caffeinated beverages, and cigarettes, especially close to bedtime. These can disrupt your  sleep.  Do not overeat or eat spicy foods right before bedtime. This can lead to digestive discomfort that can make it hard for you to sleep.  Limit screen use before bedtime. This includes:  Watching TV.  Using your smartphone, tablet, and computer.  Stick to a routine. This can help you fall asleep faster. Try to do a quiet activity, brush your teeth, and go to bed at the same time each night.  Get out of bed if you are still awake after 15 minutes of trying to sleep. Keep the lights down, but try reading or doing a quiet activity. When you feel sleepy, go back to bed.  Make sure that you drive carefully. Avoid driving if you feel very sleepy.  Keep all follow-up appointments as directed by your health care provider. This is important. Contact a health care provider if:  You are tired throughout the day or have trouble in your daily routine due to sleepiness.  You continue to have sleep problems or your sleep problems get worse. Get help right away if:  You have serious thoughts about hurting yourself or someone else. This information is not intended to replace advice given to you by your health care provider. Make sure you discuss any questions you have with your health care provider. Document Released: 09/07/2000 Document Revised: 02/10/2016 Document Reviewed: 06/11/2014 Elsevier Interactive Patient Education  2017 [employer].    IF you received an x-ray today, you will receive an invoice from [facility]. Please contact [facility] at [phone] with questions or concerns regarding your invoice.   IF you received labwork today, you will receive an invoice from [facility]. Please contact [facility] at [phone] with questions or concerns regarding your invoice.   Our billing staff will not be able to assist you with questions regarding bills from these companies.  You will be contacted with the lab results as soon as they are available. The fastest way  to get your results is to activate your My Chart account. Instructions are located on the last page of this paperwork. If you have not heard from [place] regarding the results in 2 weeks, please contact this office.    We recommend that you schedule a mammogram for breast cancer screening. Typically, you do not need a referral to do this. Please contact a local imaging center to schedule your mammogram.  [facility] [place] - [phone]  *ask for the Radiology Department [facility] - [phone] or [phone]  [facility] - [phone] [place] [phone] [facility] - [phone]  *ask for the [facility] - [phone]  *ask for the Radiology Department [facility] - [phone]  *ask for the [place] - [phone]

## 2016-10-01 NOTE — Progress Notes (Signed)
Subjective:  By signing my name below, I, [name], attest that this documentation has been prepared under the direction and in the presence of [name], MD.  Electronically Signed: [name], ED Scribe. 10/01/2016. 10:28 AM.   Patient ID: Andrea Morrow, female    DOB: 1951-01-20, 66 y.o.   MRN: [id number]  HPI   Chief Complaint  Patient presents with  . Annual Exam    HPI Comments: Andrea Morrow is a 66 y.o. female who presents to the [facility] for a physical.  Hyperlipidemia  Lab Results  Component Value Date   CHOL 270 (H) 03/30/2016   HDL 80 03/30/2016   LDLCALC 165 (H) 03/30/2016   TRIG 127 03/30/2016   CHOLHDL 3.4 03/30/2016   Lab Results  Component Value Date   ALT 18 03/30/2016   AST 23 03/30/2016   ALKPHOS 51 03/30/2016   BILITOT 0.5 03/30/2016   She is on Crestor 10 mg qd. Pt is fasting.  Marland Kitchen Hypertension  Lab Results  Component Value Date   CREATININE 0.95 03/30/2016   She is on HCTZ 25mg  qd. She denies side effects with medications including chest pain.   Cancer Screening Colonoscopy-05/2005, normal repeat in 10 years. Will wait until she gets insurance.  Breast cancer screening- mammogram 03/2016, normal, in [place] Cervical cancer screening-  Last pap smear 2016, normal  Depression screening  Depression screen [facility] 2/9 10/01/2016 07/23/2016 03/30/2016 08/09/2015 06/21/2015  Decreased Interest 0 0 0 0 0  Down, Depressed, Hopeless 0 0 0 0 0  PHQ - 2 Score 0 0 0 0 0     Immunizations  Immunization History  Administered Date(s) Administered  . Tdap 09/27/2014  flu- has not had flu shots. Will have this done at [facility]- has not had shingles vaccines.   Vision  Visual Acuity Screening   Right eye Left eye Both eyes  Without correction:     With correction: 20/25 20/25 20/25    Last eye exam 1.5 years ago.   Dentist Has been seen by dentist within the last year. Possibly planning braces and implants.    Exercise  Pt does not exercise regularly  HepC screening- has never had this done. Will hold off until insurance coverage. HIV screening- has never had this done. Will hold off until insurance coverage.  Fatigue Was treated for bronchitis 1 month ago. States symptoms resolved but began to developed cough and sneeze 1 week ago   Hearing loss Reports husband has mentioned decreased hearing  Sleep disturbance Pt reports waking up during the night. Most of the time she is able to fall back asleep but will occasionally have difficulty falling back asleep.    Patient Active Problem List   Diagnosis Date Noted  . Hyperlipidemia 03/30/2016  . Essential hypertension 03/30/2016  . Other screening mammogram 01/28/2014   Past Medical History:  Diagnosis Date  . Hyperlipidemia   . Hypertension    Past Surgical History:  Procedure Laterality Date  . TUBAL LIGATION     No Known Allergies Prior to Admission medications   Medication Sig Start Date End Date Taking? Authorizing Provider  aspirin EC 81 MG tablet Take 81 mg by mouth daily. Reported on 03/30/2016   Yes Historical Provider, MD  hydrochlorothiazide (HYDRODIURIL) 25 MG tablet Take 1 tablet (25 mg total) by mouth daily. 03/30/16  Yes [name], MD  Multiple Vitamins-Calcium (ONE-A-DAY WOMENS PO) Take by mouth.   Yes Historical Provider,  MD  rosuvastatin (CRESTOR) 10 MG tablet Take 1 tablet (10 mg total) by mouth daily. 06/27/16  Yes [name], MD   Social History   Social History  . Marital status: Married    Spouse name: N/A  . Number of children: N/A  . Years of education: N/A   Occupational History  . Not on file.   Social History Main Topics  . Smoking status: Never Smoker  . Smokeless tobacco: Never Used  . Alcohol use Yes  . Drug use: No  . Sexual activity: Not on file   Other Topics Concern  . Not on file   Social History Narrative   Married   Clinical research associate      Review of Systems 13 point  ROS negative except fatigue, hearing loss, sneezing, and sleep disturbances    Objective:   Physical Exam  Constitutional: She is oriented to person, place, and time. She appears well-developed and well-nourished.  HENT:  Head: Normocephalic and atraumatic.  Right Ear: External ear normal.  Left Ear: External ear normal.  Mouth/Throat: Oropharynx is clear and moist.  Eyes: Conjunctivae are normal. Pupils are equal, round, and reactive to light.  Neck: Normal range of motion. Neck supple. No thyromegaly present.  Cardiovascular: Normal rate, regular rhythm, normal heart sounds and intact distal pulses.   No murmur heard. Pulmonary/Chest: Effort normal and breath sounds normal. No respiratory distress. She has no wheezes.  Abdominal: Soft. Bowel sounds are normal. There is no tenderness.  Musculoskeletal: Normal range of motion. She exhibits no edema or tenderness.  Lymphadenopathy:    She has no cervical adenopathy.  Neurological: She is alert and oriented to person, place, and time.  Skin: Skin is warm and dry. No rash noted.  Psychiatric: She has a normal mood and affect. Her behavior is normal. Thought content normal.  Vitals reviewed.    Vitals:   10/01/16 1035 10/01/16 1040  BP: 124/90 (!) 126/92  Pulse: 81   Resp: 16   Temp: 98 F (36.7 C)   TempSrc: Oral   SpO2: 97%   Weight: 164 lb 9.6 oz (74.7 kg)   Height: 5' 1.5" (1.562 m)      Assessment & Plan:   Andrea Morrow is a 66 y.o. female Annual physical exam  - -anticipatory guidance as below in AVS, screening labs above. Health maintenance items as above in HPI discussed/recommended as applicable.   Screen for colon cancer - Plan: Ambulatory referral to Gastroenterology  - referred to GI.   Essential hypertension - Plan: hydrochlorothiazide (HYDRODIURIL) 25 MG tablet  - stable, Tolerating current dose of HCTZ. Refilled. Labs pending.   Mixed hyperlipidemia - Plan: Comprehensive metabolic panel, Lipid panel,  rosuvastatin (CRESTOR) 10 MG tablet  - Tolerating Crestor. No change in dosage. Labs pending, meds refilled.  Need for shingles vaccine - Plan: Zoster Vaccine Live, PF, (ZOSTAVAX) 16109 UNT/0.65ML injection  - Zostavax prescription given. See telephone message after visit, either shingles vaccine an option, but Zostavax okay  Meds ordered this encounter  Medications  . hydrochlorothiazide (HYDRODIURIL) 25 MG tablet    Sig: Take 1 tablet (25 mg total) by mouth daily.    Dispense:  90 tablet    Refill:  2  . rosuvastatin (CRESTOR) 10 MG tablet    Sig: Take 1 tablet (10 mg total) by mouth daily.    Dispense:  90 tablet    Refill:  2  . Zoster Vaccine Live, PF, (ZOSTAVAX) 60454 UNT/0.65ML  injection    Sig: Inject 19,400 Units into the skin once.    Dispense:  1 each    Refill:  0   Patient Instructions   Get your flu shot as soon as possible.  Shingles vaccine given for administration at your pharmacy Have [facility] screen [facility] at [facility] if possible, or let me know if other resource needed.    avoid caffeine after lunch. exercise during day - not within 3 hours of bedtime. avoid late night meals.  bed for sleep or sex only. turn clock/alarm clock away so can't be seen from bed, if unable to fall asleep in what feels like 20-30 minutes get up and read book in dim light until tired again.    You can try the HCTZ in the morning to see if that lessens frequency of urinating at night.   Return to the clinic or go to the nearest emergency room if any of your symptoms worsen or new symptoms occur.  Keeping You Healthy  Get These Tests  Blood Pressure- Have your blood pressure checked by your healthcare provider at least once a year.  Normal blood pressure is 120/80.  Weight- Have your body mass index (BMI) calculated to screen for obesity.  BMI is a measure of body fat based on height and weight.  You can calculate your own BMI at [employer].it  Cholesterol- Have your  cholesterol checked every year.  Diabetes- Have your blood sugar checked every year if you have high blood pressure, high cholesterol, a family history of diabetes or if you are overweight.  Pap Test - Have a pap test every 1 to 5 years if you have been sexually active.  If you are older than 65 and recent pap tests have been normal you may not need additional pap tests.  In addition, if you have had a hysterectomy  for benign disease additional pap tests are not necessary.  Mammogram-Yearly mammograms are essential for early detection of breast cancer  Screening for Colon Cancer- Colonoscopy starting at age 44. Screening may begin sooner depending on your family history and other health conditions.  Follow up colonoscopy as directed by your Gastroenterologist.  Screening for Osteoporosis- Screening begins at age 39 with bone density scanning, sooner if you are at higher risk for developing Osteoporosis.  Get these medicines  Calcium with Vitamin D- Your body requires 1200-1500 mg of Calcium a day and [phone] IU of Vitamin D a day.  You can only absorb 500 mg of Calcium at a time therefore Calcium must be taken in 2 or 3 separate doses throughout the day.  Hormones- Hormone therapy has been associated with increased risk for certain cancers and heart disease.  Talk to your healthcare provider about if you need relief from menopausal symptoms.  Aspirin- Ask your healthcare provider about taking Aspirin to prevent Heart Disease and Stroke.  Get these Immuniztions  Flu shot- Every fall  Pneumonia shot- Once after the age of 67; if you are younger ask your healthcare provider if you need a pneumonia shot.  Tetanus- Every ten years.  Zostavax- Once after the age of 10 to prevent shingles.  Take these steps  Don't smoke- Your healthcare provider can help you quit. For tips on how to quit, ask your healthcare provider or go to www.smokefree.gov or call 1-800 QUIT-NOW.  Be physically  active- Exercise 5 days a week for a minimum of 30 minutes.  If you are not already physically active, start slow and gradually  work up to 30 minutes of moderate physical activity.  Try walking, dancing, bike riding, swimming, etc.  Eat a healthy diet- Eat a variety of healthy foods such as fruits, vegetables, whole grains, low fat milk, low fat cheeses, yogurt, lean meats, chicken, fish, eggs, dried beans, tofu, etc.  For more information go to www.thenutritionsource.org  Dental visit- Brush and floss teeth twice daily; visit your dentist twice a year.  Eye exam- Visit your Optometrist or Ophthalmologist yearly.  Drink alcohol in moderation- Limit alcohol intake to one drink or less a day.  Never drink and drive.  Depression- Your emotional health is as important as your physical health.  If you're feeling down or losing interest in things you normally enjoy, please talk to your healthcare provider.  Seat Belts- can save your life; always wear one  Smoke/Carbon Monoxide detectors- These detectors need to be installed on the appropriate level of your home.  Replace batteries at least once a year.  Violence- If anyone is threatening or hurting you, please tell your healthcare provider.  Living Will/ Health care power of attorney- Discuss with your healthcare provider and family.  Insomnia Insomnia is a sleep disorder that makes it difficult to fall asleep or to stay asleep. Insomnia can cause tiredness (fatigue), low energy, difficulty concentrating, mood swings, and poor performance at work or school. There are three different ways to classify insomnia:  Difficulty falling asleep.  Difficulty staying asleep.  Waking up too early in the morning. Any type of insomnia can be long-term (chronic) or short-term (acute). Both are common. Short-term insomnia usually lasts for three months or less. Chronic insomnia occurs at least three times a week for longer than three months. What are the  causes? Insomnia may be caused by another condition, situation, or substance, such as:  Anxiety.  Certain medicines.  Gastroesophageal reflux disease (GERD) or other gastrointestinal conditions.  Asthma or other breathing conditions.  Restless legs syndrome, sleep apnea, or other sleep disorders.  Chronic pain.  Menopause. This may include hot flashes.  Stroke.  Abuse of alcohol, tobacco, or illegal drugs.  Depression.  Caffeine.  Neurological disorders, such as Alzheimer disease.  An overactive thyroid (hyperthyroidism). The cause of insomnia may not be known. What increases the risk? Risk factors for insomnia include:  Gender. Women are more commonly affected than men.  Age. Insomnia is more common as you get older.  Stress. This may involve your professional or personal life.  Income. Insomnia is more common in people with lower income.  Lack of exercise.  Irregular work schedule or night shifts.  Traveling between different time zones. What are the signs or symptoms? If you have insomnia, trouble falling asleep or trouble staying asleep is the main symptom. This may lead to other symptoms, such as:  Feeling fatigued.  Feeling nervous about going to sleep.  Not feeling rested in the morning.  Having trouble concentrating.  Feeling irritable, anxious, or depressed. How is this treated? Treatment for insomnia depends on the cause. If your insomnia is caused by an underlying condition, treatment will focus on addressing the condition. Treatment may also include:  Medicines to help you sleep.  Counseling or therapy.  Lifestyle adjustments. Follow these instructions at home:  Take medicines only as directed by your health care provider.  Keep regular sleeping and waking hours. Avoid naps.  Keep a sleep diary to help you and your health care provider figure out what could be causing your insomnia. Include:  When you  sleep.  When you wake up  during the night.  How well you sleep.  How rested you feel the next day.  Any side effects of medicines you are taking.  What you eat and drink.  Make your bedroom a comfortable place where it is easy to fall asleep:  Put up shades or special blackout curtains to block light from outside.  Use a white noise machine to block noise.  Keep the temperature cool.  Exercise regularly as directed by your health care provider. Avoid exercising right before bedtime.  Use relaxation techniques to manage stress. Ask your health care provider to suggest some techniques that may work well for you. These may include:  Breathing exercises.  Routines to release muscle tension.  Visualizing peaceful scenes.  Cut back on alcohol, caffeinated beverages, and cigarettes, especially close to bedtime. These can disrupt your sleep.  Do not overeat or eat spicy foods right before bedtime. This can lead to digestive discomfort that can make it hard for you to sleep.  Limit screen use before bedtime. This includes:  Watching TV.  Using your smartphone, tablet, and computer.  Stick to a routine. This can help you fall asleep faster. Try to do a quiet activity, brush your teeth, and go to bed at the same time each night.  Get out of bed if you are still awake after 15 minutes of trying to sleep. Keep the lights down, but try reading or doing a quiet activity. When you feel sleepy, go back to bed.  Make sure that you drive carefully. Avoid driving if you feel very sleepy.  Keep all follow-up appointments as directed by your health care provider. This is important. Contact a health care provider if:  You are tired throughout the day or have trouble in your daily routine due to sleepiness.  You continue to have sleep problems or your sleep problems get worse. Get help right away if:  You have serious thoughts about hurting yourself or someone else. This information is not intended to replace  advice given to you by your health care provider. Make sure you discuss any questions you have with your health care provider. Document Released: 09/07/2000 Document Revised: 02/10/2016 Document Reviewed: 06/11/2014 Elsevier Interactive Patient Education  2017 [employer].    IF you received an x-ray today, you will receive an invoice from [facility]. Please contact [facility] at [phone] with questions or concerns regarding your invoice.   IF you received labwork today, you will receive an invoice from [facility]. Please contact [facility] at [phone] with questions or concerns regarding your invoice.   Our billing staff will not be able to assist you with questions regarding bills from these companies.  You will be contacted with the lab results as soon as they are available. The fastest way to get your results is to activate your My Chart account. Instructions are located on the last page of this paperwork. If you have not heard from [place] regarding the results in 2 weeks, please contact this office.    We recommend that you schedule a mammogram for breast cancer screening. Typically, you do not need a referral to do this. Please contact a local imaging center to schedule your mammogram.  [facility] - [phone]  *ask for the Radiology [place] - [phone] or [phone]  [place] [phone] [place] [phone] [facility] - [phone]  *ask for the  [facility] - [phone]  *ask for the Radiology Department [facility] - [phone]  *ask for the [facility] - [phone]   I personally performed the services described in this documentation, which was scribed in my presence. The recorded information has been reviewed and considered, and addended by me as needed.    Signed,   [name], MD [facility].  10/03/16 2:22 PM

## 2016-10-02 LAB — COMPREHENSIVE METABOLIC PANEL
A/G RATIO: 1.8 (ref 1.2–2.2)
ALBUMIN: 4.5 g/dL (ref 3.6–4.8)
ALT: 21 IU/L (ref 0–32)
AST: 28 IU/L (ref 0–40)
Alkaline Phosphatase: 61 IU/L (ref 39–117)
BUN/Creatinine Ratio: 17 (ref 12–28)
BUN: 14 mg/dL (ref 8–27)
Bilirubin Total: 0.4 mg/dL (ref 0.0–1.2)
CALCIUM: 9.5 mg/dL (ref 8.7–10.3)
CO2: 28 mmol/L (ref 18–29)
Chloride: 95 mmol/L — ABNORMAL LOW (ref 96–106)
Creatinine, Ser: 0.82 mg/dL (ref 0.57–1.00)
GFR calc non Af Amer: 76 mL/min/{1.73_m2} (ref >59)
GFR, EST AFRICAN AMERICAN: 87 mL/min/{1.73_m2} (ref >59)
GLOBULIN, TOTAL: 2.5 g/dL (ref 1.5–4.5)
Glucose: 100 mg/dL — ABNORMAL HIGH (ref 65–99)
POTASSIUM: 3.9 mmol/L (ref 3.5–5.2)
SODIUM: 141 mmol/L (ref 134–144)
TOTAL PROTEIN: 7 g/dL (ref 6.0–8.5)

## 2016-10-02 LAB — LIPID PANEL
Chol/HDL Ratio: 2.4 ratio units (ref 0.0–4.4)
Cholesterol, Total: 157 mg/dL (ref 100–199)
HDL: 65 mg/dL (ref >39)
LDL Calculated: 75 mg/dL (ref 0–99)
Triglycerides: 83 mg/dL (ref 0–149)
VLDL Cholesterol Cal: 17 mg/dL (ref 5–40)

## 2016-10-03 ENCOUNTER — Telehealth: Payer: Self-pay

## 2016-10-03 NOTE — Telephone Encounter (Signed)
Have you heard of this? Advice?

## 2016-10-03 NOTE — Telephone Encounter (Signed)
PATIENT STATES SHE HAD HER PHYSICAL DONE WITH DR. Carlota Raspberry ON Monday AND HE SUGGESTED SHE GET THE SHINGLES SHOT. SHE WENT TO [facility] AND IT IS $230.00. SHE CALLED [facility] ([address]) AND THEY TOLD HER THERE IS A NEW SHINGLES SHOT COMING OUT VERY SOON THAT IS MORE EFFECTIVE AND IT IS A 2 STEP PROCESS. YOU GET THE 1st SHOT NOW AND COME BACK IN 6 MONTHS TO GET THE 2nd SHOT. SHE WANTS TO KNOW WHICH ONE SHE SHOULD DO? BEST PHONE [phone] (CELL)  [place]

## 2016-10-03 NOTE — Telephone Encounter (Signed)
There is a new vaccine since October of last year that is 2 doses. It is thought to be possibly more effective, especially for those over age 65, but there is still some debate whether or not that is recommended over the standard Zostavax vaccine. Either one is an option for her. I have not switched my patients over to the new two-part vaccine yet as the final recommendation from the agency on vaccines is still not there as there was some possible concern of long term safety and efficacy of the 2 part vaccine. Again, either is an option.

## 2016-10-08 NOTE — Telephone Encounter (Signed)
L/m with dr greenes note 

## 2017-01-30 ENCOUNTER — Encounter: Payer: Self-pay | Admitting: Family Medicine

## 2017-06-26 ENCOUNTER — Other Ambulatory Visit: Payer: Self-pay | Admitting: Family Medicine

## 2017-06-26 DIAGNOSIS — E782 Mixed hyperlipidemia: Secondary | ICD-10-CM

## 2017-06-26 DIAGNOSIS — I10 Essential (primary) hypertension: Secondary | ICD-10-CM

## 2017-07-30 ENCOUNTER — Other Ambulatory Visit: Payer: Self-pay | Admitting: Family Medicine

## 2017-07-30 DIAGNOSIS — I10 Essential (primary) hypertension: Secondary | ICD-10-CM

## 2017-07-30 DIAGNOSIS — E782 Mixed hyperlipidemia: Secondary | ICD-10-CM

## 2017-08-05 ENCOUNTER — Ambulatory Visit: Payer: Self-pay | Admitting: Family Medicine

## 2017-08-05 ENCOUNTER — Telehealth: Payer: Self-pay | Admitting: Family Medicine

## 2017-08-05 NOTE — Telephone Encounter (Signed)
Copied from [place] #[id number]. Topic: Quick Communication - See Telephone Encounter >> Aug 05, 2017  8:36 AM Burnis Medin, NT wrote: CRM for notification. See Telephone encounter for: Pt. Called in about getting a refill on medication. Pt. [place] and [place] medicare on Dec. 1 and wants to see if she can get a refill on the following medications. hydrochlorothiazide (HYDRODIURIL) 25 MG tablet and rosuvastatin (CRESTOR) 10 MG tablet. Pt. Just needs need medication just until medicare kicks in. Pt. Uses [facility]  08/05/17.

## 2017-08-07 ENCOUNTER — Other Ambulatory Visit: Payer: Self-pay

## 2017-08-07 DIAGNOSIS — E782 Mixed hyperlipidemia: Secondary | ICD-10-CM

## 2017-08-07 DIAGNOSIS — I10 Essential (primary) hypertension: Secondary | ICD-10-CM

## 2017-08-07 MED ORDER — HYDROCHLOROTHIAZIDE 25 MG PO TABS: 25.0000 mg | ORAL_TABLET | Freq: Every day | ORAL | 0 refills | Status: DC

## 2017-08-07 MED ORDER — ROSUVASTATIN CALCIUM 10 MG PO TABS: 10.0000 mg | ORAL_TABLET | Freq: Every day | ORAL | 0 refills | Status: DC

## 2017-08-07 NOTE — Telephone Encounter (Signed)
Sent #20 of Hydrodiuril and Crestor to get pt to Dec 4 visit.

## 2017-08-27 ENCOUNTER — Ambulatory Visit (INDEPENDENT_AMBULATORY_CARE_PROVIDER_SITE_OTHER): Payer: Medicare HMO | Admitting: Family Medicine

## 2017-08-27 ENCOUNTER — Encounter: Payer: Self-pay | Admitting: Family Medicine

## 2017-08-27 VITALS — BP 122/72 | HR 72 | Temp 98.2°F | Resp 18 | Ht 62.0 in | Wt 164.0 lb

## 2017-08-27 DIAGNOSIS — R69 Illness, unspecified: Secondary | ICD-10-CM | POA: Diagnosis not present

## 2017-08-27 DIAGNOSIS — Z1159 Encounter for screening for other viral diseases: Secondary | ICD-10-CM | POA: Diagnosis not present

## 2017-08-27 DIAGNOSIS — E782 Mixed hyperlipidemia: Secondary | ICD-10-CM

## 2017-08-27 DIAGNOSIS — I10 Essential (primary) hypertension: Secondary | ICD-10-CM | POA: Diagnosis not present

## 2017-08-27 DIAGNOSIS — Z1211 Encounter for screening for malignant neoplasm of colon: Secondary | ICD-10-CM | POA: Diagnosis not present

## 2017-08-27 DIAGNOSIS — Z23 Encounter for immunization: Secondary | ICD-10-CM | POA: Diagnosis not present

## 2017-08-27 DIAGNOSIS — Z114 Encounter for screening for human immunodeficiency virus [HIV]: Secondary | ICD-10-CM

## 2017-08-27 MED ORDER — ZOSTER VAC RECOMB ADJUVANTED 50 MCG/0.5ML IM SUSR: 0.5000 mL | Freq: Once | INTRAMUSCULAR | 1 refills | Status: AC

## 2017-08-27 MED ORDER — ROSUVASTATIN CALCIUM 10 MG PO TABS: 10.0000 mg | ORAL_TABLET | Freq: Every day | ORAL | 1 refills | Status: DC

## 2017-08-27 MED ORDER — HYDROCHLOROTHIAZIDE 25 MG PO TABS: 25.0000 mg | ORAL_TABLET | Freq: Every day | ORAL | 1 refills | Status: DC

## 2017-08-27 NOTE — Progress Notes (Signed)
Subjective:  By signing my name below, I, [name], attest that this documentation has been prepared under the direction and in the presence of [name], MD Electronically Signed: Ladene Artist, ED Scribe 08/27/2017 at 12:29 PM.   Patient ID: Andrea Morrow, female    DOB: 1951-06-06, 66 y.o.   MRN: [id number]  Chief Complaint  Patient presents with  . Medication Refill    hydrodiuril crestor    HPI Andrea Morrow is a 66 y.o. female who presents to Primary Care at [facility] for f/u. Pt is not fasting; she had oatmeal around 9:30 AM this morning.  Hyperlipidemia Lab Results  Component Value Date   CHOL 157 10/01/2016   HDL 65 10/01/2016   LDLCALC 75 10/01/2016   TRIG 83 10/01/2016   CHOLHDL 2.4 10/01/2016   Lab Results  Component Value Date   ALT 21 10/01/2016   AST 28 10/01/2016   ALKPHOS 61 10/01/2016   BILITOT 0.4 10/01/2016  Crestor 10 mg qd. Denies myalgias, fatigue, abdominal pain, any other side effects.   HTN Lab Results  Component Value Date   CREATININE 0.82 10/01/2016  HCTZ 25 mg qd. Discussed taking meds in the morning to see if that helped nocturia. Also discussed sleep hygiene last visit to help with some difficulty of sleep. Today, pt denies side effects from medication. Pt wakes twice during the night to urinate but states she is able to go back to sleep. States this has unchanged for several years. Pt does report having a cup of coffee after dinner but states she often doesn't finish it. Denies hematuria, dysuria, abdominal pain. Reports minimal exercise.   Cough Does report dry cough last week that resolved. Denies fever, sob.  Health Maintenance Pt has never had a HIV Screening or Hep C Screening. Plans to get the Shingrix vaccine at pharmacy.  Colonoscopy Pt requests a referral for a colonoscopy. Last colonoscopy was done by Dr. Ardis Hughs with [facility].  Patient Active Problem List   Diagnosis Date Noted  . Hyperlipidemia 03/30/2016  .  Essential hypertension 03/30/2016  . Other screening mammogram 01/28/2014   Past Medical History:  Diagnosis Date  . Hyperlipidemia   . Hypertension    Past Surgical History:  Procedure Laterality Date  . TUBAL LIGATION     No Known Allergies Prior to Admission medications   Medication Sig Start Date End Date Taking? Authorizing Provider  aspirin EC 81 MG tablet Take 81 mg by mouth daily. Reported on 03/30/2016    [provider]  hydrochlorothiazide (HYDRODIURIL) 25 MG tablet Take 1 tablet (25 mg total) daily by mouth. **MUST HAVE OFFICE VISIT BEFORE ANY ADDITIONAL REFILLS** 08/07/17   [name], MD  Multiple Vitamins-Calcium (ONE-A-DAY WOMENS PO) Take by mouth.    [provider]  rosuvastatin (CRESTOR) 10 MG tablet Take 1 tablet (10 mg total) daily by mouth. **MUST HAVE OFFICE VISIT BEFORE ANY ADDITIONAL REFILLS** 08/07/17   [name], MD   Social History   Socioeconomic History  . Marital status: Married    Spouse name: Not on file  . Number of children: Not on file  . Years of education: Not on file  . Highest education level: Not on file  Social Needs  . Financial resource strain: Not on file  . Food insecurity - worry: Not on file  . Food insecurity - inability: Not on file  . Transportation needs - medical: Not on file  . Transportation needs -  non-medical: Not on file  Occupational History  . Not on file  Tobacco Use  . Smoking status: Never Smoker  . Smokeless tobacco: Never Used  Substance and Sexual Activity  . Alcohol use: Yes  . Drug use: No  . Sexual activity: Not on file  Other Topics Concern  . Not on file  Social History Narrative   Married   Clinical research associate   Review of Systems  Constitutional: Negative for fatigue, fever and unexpected weight change.  Respiratory: Positive for cough (resolved). Negative for chest tightness and shortness of breath.   Cardiovascular: Negative for chest pain, palpitations and leg  swelling.  Gastrointestinal: Negative for abdominal pain and blood in stool.  Genitourinary: Negative for dysuria and hematuria.  Musculoskeletal: Negative for myalgias.  Neurological: Negative for dizziness, syncope, light-headedness and headaches.      Objective:   Physical Exam  Constitutional: She is oriented to person, place, and time. She appears well-developed and well-nourished.  HENT:  Head: Normocephalic and atraumatic.  Eyes: Conjunctivae and EOM are normal. Pupils are equal, round, and reactive to light.  Neck: Carotid bruit is not present.  Cardiovascular: Normal rate, regular rhythm, normal heart sounds and intact distal pulses.  Pulmonary/Chest: Effort normal and breath sounds normal.  Abdominal: Soft. She exhibits no pulsatile midline mass. There is no tenderness.  Neurological: She is alert and oriented to person, place, and time.  Skin: Skin is warm and dry.  Psychiatric: She has a normal mood and affect. Her behavior is normal.  Vitals reviewed.   Vitals:   08/27/17 1148  BP: 122/72  Pulse: 72  Resp: 18  Temp: 98.2 F (36.8 C)  TempSrc: Oral  SpO2: 98%  Weight: 164 lb (74.4 kg)  Height: 5\' 2"  (1.575 m)      Assessment & Plan:   [name] is a 66 y.o. female Essential hypertension - Plan: hydrochlorothiazide (HYDRODIURIL) 25 MG tablet, Comprehensive metabolic panel   -  Stable, tolerating current regimen. Medications refilled. Labs pending as above.   Mixed hyperlipidemia - Plan: rosuvastatin (CRESTOR) 10 MG tablet, Lipid panel  -  Stable, tolerating current regimen. Medications refilled. Labs pending as above.   Screen for colon cancer - Plan: Ambulatory referral to Gastroenterology  Need for shingles vaccine - Plan: Zoster Vaccine Adjuvanted [facility]) injection  Need for hepatitis C screening test - Plan: Hepatitis C antibody  Needs flu shot - Plan: Flu Vaccine QUAD 36+ mos IM  Screening for HIV without presence of risk factors - Plan: HIV  antibody   Meds ordered this encounter  Medications  . rosuvastatin (CRESTOR) 10 MG tablet    Sig: Take 1 tablet (10 mg total) by mouth daily.    Dispense:  90 tablet    Refill:  1  . hydrochlorothiazide (HYDRODIURIL) 25 MG tablet    Sig: Take 1 tablet (25 mg total) by mouth daily.    Dispense:  90 tablet    Refill:  1  . Zoster Vaccine Adjuvanted [facility]) injection    Sig: Inject 0.5 mLs into the muscle once for 1 dose. Repeat in 2-6 months.    Dispense:  0.5 mL    Refill:  1   Patient Instructions   Try adjusting fluids in the evening to see if that lessens wakening at night.  Return for fasting labs in next week (lab only visit).   No med changes for now.   I sent shingles vaccine to your pharmacy.   Flu  shot today.   Thanks for coming in today.   IF you received an x-ray today, you will receive an invoice from [facility]. Please contact [facility] at [phone] with questions or concerns regarding your invoice.   IF you received labwork today, you will receive an invoice from [facility]. Please contact [facility] at [phone] with questions or concerns regarding your invoice.   Our billing staff will not be able to assist you with questions regarding bills from these companies.  You will be contacted with the lab results as soon as they are available. The fastest way to get your results is to activate your My Chart account. Instructions are located on the last page of this paperwork. If you have not heard from [place] regarding the results in 2 weeks, please contact this office.       I personally performed the services described in this documentation, which was scribed in my presence. The recorded information has been reviewed and considered for accuracy and completeness, addended by me as needed, and agree with information above.  Signed,   [name], MD Primary [facility] at [facility].  08/30/17 11:51 AM

## 2017-08-27 NOTE — Patient Instructions (Addendum)
Try adjusting fluids in the evening to see if that lessens wakening at night.  Return for fasting labs in next week (lab only visit).   No med changes for now.   I sent shingles vaccine to your pharmacy.   Flu shot today.   Thanks for coming in today.   IF you received an x-ray today, you will receive an invoice from [facility]. Please contact [facility] at [phone] with questions or concerns regarding your invoice.   IF you received labwork today, you will receive an invoice from [facility]. Please contact [facility] at [phone] with questions or concerns regarding your invoice.   Our billing staff will not be able to assist you with questions regarding bills from these companies.  You will be contacted with the lab results as soon as they are available. The fastest way to get your results is to activate your My Chart account. Instructions are located on the last page of this paperwork. If you have not heard from [place] regarding the results in 2 weeks, please contact this office.

## 2017-08-29 ENCOUNTER — Encounter: Payer: Self-pay | Admitting: Gastroenterology

## 2017-09-06 ENCOUNTER — Ambulatory Visit (INDEPENDENT_AMBULATORY_CARE_PROVIDER_SITE_OTHER): Payer: Medicare HMO | Admitting: Physician Assistant

## 2017-09-06 DIAGNOSIS — E782 Mixed hyperlipidemia: Secondary | ICD-10-CM

## 2017-09-06 DIAGNOSIS — R69 Illness, unspecified: Secondary | ICD-10-CM | POA: Diagnosis not present

## 2017-09-06 DIAGNOSIS — Z114 Encounter for screening for human immunodeficiency virus [HIV]: Secondary | ICD-10-CM

## 2017-09-06 DIAGNOSIS — I10 Essential (primary) hypertension: Secondary | ICD-10-CM | POA: Diagnosis not present

## 2017-09-06 DIAGNOSIS — Z1159 Encounter for screening for other viral diseases: Secondary | ICD-10-CM | POA: Diagnosis not present

## 2017-09-07 LAB — COMPREHENSIVE METABOLIC PANEL
ALK PHOS: 68 IU/L (ref 39–117)
ALT: 20 IU/L (ref 0–32)
AST: 26 IU/L (ref 0–40)
Albumin/Globulin Ratio: 2 (ref 1.2–2.2)
Albumin: 4.6 g/dL (ref 3.6–4.8)
BUN/Creatinine Ratio: 14 (ref 12–28)
BUN: 13 mg/dL (ref 8–27)
Bilirubin Total: 0.4 mg/dL (ref 0.0–1.2)
CO2: 25 mmol/L (ref 20–29)
CREATININE: 0.9 mg/dL (ref 0.57–1.00)
Calcium: 10 mg/dL (ref 8.7–10.3)
Chloride: 103 mmol/L (ref 96–106)
GFR calc Af Amer: 78 mL/min/{1.73_m2} (ref >59)
GFR calc non Af Amer: 68 mL/min/{1.73_m2} (ref >59)
GLOBULIN, TOTAL: 2.3 g/dL (ref 1.5–4.5)
Glucose: 97 mg/dL (ref 65–99)
POTASSIUM: 4.3 mmol/L (ref 3.5–5.2)
SODIUM: 144 mmol/L (ref 134–144)
Total Protein: 6.9 g/dL (ref 6.0–8.5)

## 2017-09-07 LAB — LIPID PANEL
CHOL/HDL RATIO: 2.3 ratio (ref 0.0–4.4)
Cholesterol, Total: 167 mg/dL (ref 100–199)
HDL: 73 mg/dL (ref >39)
LDL Calculated: 78 mg/dL (ref 0–99)
Triglycerides: 78 mg/dL (ref 0–149)
VLDL Cholesterol Cal: 16 mg/dL (ref 5–40)

## 2017-09-07 LAB — HIV ANTIBODY (ROUTINE TESTING W REFLEX): HIV Screen 4th Generation wRfx: NONREACTIVE

## 2017-09-07 LAB — HEPATITIS C ANTIBODY

## 2017-09-13 ENCOUNTER — Ambulatory Visit (AMBULATORY_SURGERY_CENTER): Payer: Self-pay

## 2017-09-13 ENCOUNTER — Other Ambulatory Visit: Payer: Self-pay

## 2017-09-13 VITALS — Ht 61.0 in | Wt 168.0 lb

## 2017-09-13 DIAGNOSIS — Z1211 Encounter for screening for malignant neoplasm of colon: Secondary | ICD-10-CM

## 2017-09-13 MED ORDER — NA SULFATE-K SULFATE-MG SULF 17.5-3.13-1.6 GM/177ML PO SOLN: 1.0000 | Freq: Once | ORAL | 0 refills | Status: AC

## 2017-09-13 NOTE — Progress Notes (Signed)
Denies allergies to eggs or soy products. Denies complication of anesthesia or sedation. Denies use of weight loss medication. Denies use of O2.   Emmi instructions declined.  

## 2017-09-30 DIAGNOSIS — R69 Illness, unspecified: Secondary | ICD-10-CM | POA: Diagnosis not present

## 2017-10-07 ENCOUNTER — Ambulatory Visit (AMBULATORY_SURGERY_CENTER): Payer: Medicare HMO | Admitting: Gastroenterology

## 2017-10-07 ENCOUNTER — Encounter: Payer: Self-pay | Admitting: Gastroenterology

## 2017-10-07 ENCOUNTER — Other Ambulatory Visit: Payer: Self-pay

## 2017-10-07 VITALS — BP 110/69 | HR 72 | Temp 96.6°F | Resp 13 | Ht 61.0 in | Wt 168.0 lb

## 2017-10-07 DIAGNOSIS — K621 Rectal polyp: Secondary | ICD-10-CM | POA: Diagnosis not present

## 2017-10-07 DIAGNOSIS — Z1212 Encounter for screening for malignant neoplasm of rectum: Secondary | ICD-10-CM

## 2017-10-07 DIAGNOSIS — D128 Benign neoplasm of rectum: Secondary | ICD-10-CM

## 2017-10-07 DIAGNOSIS — I10 Essential (primary) hypertension: Secondary | ICD-10-CM | POA: Diagnosis not present

## 2017-10-07 DIAGNOSIS — D129 Benign neoplasm of anus and anal canal: Secondary | ICD-10-CM

## 2017-10-07 DIAGNOSIS — Z1211 Encounter for screening for malignant neoplasm of colon: Secondary | ICD-10-CM | POA: Diagnosis not present

## 2017-10-07 DIAGNOSIS — Z86010 Personal history of colonic polyps: Secondary | ICD-10-CM | POA: Diagnosis not present

## 2017-10-07 MED ORDER — SODIUM CHLORIDE 0.9 % IV SOLN: 500.0000 mL | Freq: Once | INTRAVENOUS | Status: DC

## 2017-10-07 NOTE — Progress Notes (Signed)
Report given to PACU, vss 

## 2017-10-07 NOTE — Progress Notes (Signed)
Called to room to assist during endoscopic procedure.  Patient ID and intended procedure confirmed with present staff. Received instructions for my participation in the procedure from the performing physician.  

## 2017-10-07 NOTE — Patient Instructions (Signed)
Impression/recommendations:  Polyp (handout given) Diverticulosis (handout given)  YOU HAD AN ENDOSCOPIC PROCEDURE TODAY AT THE [facility]:   Refer to the procedure report that was given to you for any specific questions about what was found during the examination.  If the procedure report does not answer your questions, please call your gastroenterologist to clarify.  If you requested that your care partner not be given the details of your procedure findings, then the procedure report has been included in a sealed envelope for you to review at your convenience later.  YOU SHOULD EXPECT: Some feelings of bloating in the abdomen. Passage of more gas than usual.  Walking can help get rid of the air that was put into your GI tract during the procedure and reduce the bloating. If you had a lower endoscopy (such as a colonoscopy or flexible sigmoidoscopy) you may notice spotting of blood in your stool or on the toilet paper. If you underwent a bowel prep for your procedure, you may not have a normal bowel movement for a few days.  Please Note:  You might notice some irritation and congestion in your nose or some drainage.  This is from the oxygen used during your procedure.  There is no need for concern and it should clear up in a day or so.  SYMPTOMS TO REPORT IMMEDIATELY:   Following lower endoscopy (colonoscopy or flexible sigmoidoscopy):  Excessive amounts of blood in the stool  Significant tenderness or worsening of abdominal pains  Swelling of the abdomen that is new, acute  Fever of 100F or higher   For urgent or emergent issues, a gastroenterologist can be reached at any hour by calling [phone].   DIET:  We do recommend a small meal at first, but then you may proceed to your regular diet.  Drink plenty of fluids but you should avoid alcoholic beverages for 24 hours.  ACTIVITY:  You should plan to take it easy for the rest of today and you should NOT DRIVE or use  heavy machinery until tomorrow (because of the sedation medicines used during the test).    FOLLOW UP: Our staff will call the number listed on your records the next business day following your procedure to check on you and address any questions or concerns that you may have regarding the information given to you following your procedure. If we do not reach you, we will leave a message.  However, if you are feeling well and you are not experiencing any problems, there is no need to return our call.  We will assume that you have returned to your regular daily activities without incident.  If any biopsies were taken you will be contacted by phone or by letter within the next 1-3 weeks.  Please call us at [phone] if you have not heard about the biopsies in 3 weeks.    SIGNATURES/CONFIDENTIALITY: You and/or your care partner have signed paperwork which will be entered into your electronic medical record.  These signatures attest to the fact that that the information above on your After Visit Summary has been reviewed and is understood.  Full responsibility of the confidentiality of this discharge information lies with you and/or your care-partner. 

## 2017-10-07 NOTE — Progress Notes (Signed)
Pt's states no medical or surgical changes since previsit or office visit. 

## 2017-10-07 NOTE — Op Note (Signed)
[facility] [place] Patient Name: Andrea Morrow Procedure Date: 10/07/2017 2:05 PM MRN: [id number] Endoscopist: Milus Banister , MD Age: 67 Referring MD:  Date of Birth: 10/21/1950 Gender: Female Account #: [id number] Procedure:                Colonoscopy Indications:              Screening for colorectal malignant neoplasm;                            colonoscopy Dr. Ardis Hughs 2006 found no polyps Medicines:                Monitored Anesthesia Care Procedure:                Pre-Anesthesia Assessment:                           - Prior to the procedure, a History and Physical                            was performed, and patient medications and                            allergies were reviewed. The patient's tolerance of                            previous anesthesia was also reviewed. The risks                            and benefits of the procedure and the sedation                            options and risks were discussed with the patient.                            All questions were answered, and informed consent                            was obtained. Prior Anticoagulants: The patient has                            taken no previous anticoagulant or antiplatelet                            agents. ASA Grade Assessment: II - A patient with                            mild systemic disease. After reviewing the risks                            and benefits, the patient was deemed in                            satisfactory condition to undergo the procedure.  After obtaining informed consent, the colonoscope                            was passed under direct vision. Throughout the                            procedure, the patient's blood pressure, pulse, and                            oxygen saturations were monitored continuously. The                            Colonoscope was introduced through the anus and                            advanced to the the  cecum, identified by                            appendiceal orifice and ileocecal valve. The                            colonoscopy was performed without difficulty. The                            patient tolerated the procedure well. The quality                            of the bowel preparation was good. The ileocecal                            valve, appendiceal orifice, and rectum were                            photographed. Scope In: 2:18:29 PM Scope Out: 2:31:18 PM Scope Withdrawal Time: 0 hours 9 minutes 16 seconds  Total Procedure Duration: 0 hours 12 minutes 49 seconds  Findings:                 A 6 mm polyp was found in the rectum. The polyp was                            sessile. The polyp was removed with a cold snare.                            Resection and retrieval were complete.                           A few small diverticulum were noted in the left                            colon.                           The exam was otherwise without abnormality on  direct and retroflexion views. Complications:            No immediate complications. Estimated blood loss:                            None. Estimated Blood Loss:     Estimated blood loss: none. Impression:               - One 6 mm polyp in the rectum, removed with a cold                            snare. Resected and retrieved.                           - Diverticulosis.                           - The examination was otherwise normal on direct                            and retroflexion views. Recommendation:           - Patient has a contact number available for                            emergencies. The signs and symptoms of potential                            delayed complications were discussed with the                            patient. Return to normal activities tomorrow.                            Written discharge instructions were provided to the                             patient.                           - Resume previous diet.                           - Continue present medications.                           You will receive a letter within 2-3 weeks with the                            pathology results and my final recommendations.                           If the polyp(s) is proven to be 'pre-cancerous' on                            pathology, you will need repeat colonoscopy in 5  years. If the polyp(s) is NOT 'precancerous' on                            pathology then you should repeat colon cancer                            screening in 10 years with colonoscopy without need                            for colon cancer screening by any method prior to                            then (including stool testing). Milus Banister, MD 10/07/2017 2:33:32 PM This report has been signed electronically.

## 2017-10-08 ENCOUNTER — Telehealth: Payer: Self-pay | Admitting: *Deleted

## 2017-10-08 NOTE — Telephone Encounter (Signed)
No answer, message left for the patient. 

## 2017-10-08 NOTE — Telephone Encounter (Signed)
  Follow up Call-  Call back number 10/07/2017  Post procedure Call Back phone  # [phone]  Permission to leave phone message Yes  Some recent data might be hidden     Patient questions:  Do you have a fever, pain , or abdominal swelling? No. Pain Score  0 *  Have you tolerated food without any problems? Yes.    Have you been able to return to your normal activities? Yes.    Do you have any questions about your discharge instructions: Diet   No.  Medications  No. Follow up visit  No.  Do you have questions or concerns about your Care? No.  Actions: * If pain score is 4 or above: No action needed, pain <4.

## 2017-10-14 ENCOUNTER — Encounter: Payer: Self-pay | Admitting: Gastroenterology

## 2017-10-16 DIAGNOSIS — R69 Illness, unspecified: Secondary | ICD-10-CM | POA: Diagnosis not present

## 2017-12-19 DIAGNOSIS — R69 Illness, unspecified: Secondary | ICD-10-CM | POA: Diagnosis not present

## 2018-01-08 ENCOUNTER — Ambulatory Visit: Payer: Medicare HMO | Admitting: Emergency Medicine

## 2018-02-25 ENCOUNTER — Ambulatory Visit (INDEPENDENT_AMBULATORY_CARE_PROVIDER_SITE_OTHER): Payer: Medicare HMO | Admitting: Family Medicine

## 2018-02-25 ENCOUNTER — Encounter: Payer: Self-pay | Admitting: Family Medicine

## 2018-02-25 VITALS — BP 138/88 | HR 72 | Temp 97.8°F | Ht 61.0 in | Wt 170.8 lb

## 2018-02-25 DIAGNOSIS — Z23 Encounter for immunization: Secondary | ICD-10-CM

## 2018-02-25 DIAGNOSIS — E2839 Other primary ovarian failure: Secondary | ICD-10-CM

## 2018-02-25 DIAGNOSIS — E785 Hyperlipidemia, unspecified: Secondary | ICD-10-CM

## 2018-02-25 DIAGNOSIS — Z136 Encounter for screening for cardiovascular disorders: Secondary | ICD-10-CM | POA: Diagnosis not present

## 2018-02-25 DIAGNOSIS — R9431 Abnormal electrocardiogram [ECG] [EKG]: Secondary | ICD-10-CM

## 2018-02-25 DIAGNOSIS — Z0000 Encounter for general adult medical examination without abnormal findings: Secondary | ICD-10-CM

## 2018-02-25 DIAGNOSIS — E782 Mixed hyperlipidemia: Secondary | ICD-10-CM

## 2018-02-25 DIAGNOSIS — I10 Essential (primary) hypertension: Secondary | ICD-10-CM | POA: Diagnosis not present

## 2018-02-25 DIAGNOSIS — Z13820 Encounter for screening for osteoporosis: Secondary | ICD-10-CM | POA: Diagnosis not present

## 2018-02-25 LAB — LIPID PANEL
CHOL/HDL RATIO: 2.3 ratio (ref 0.0–4.4)
Cholesterol, Total: 157 mg/dL (ref 100–199)
HDL: 68 mg/dL (ref >39)
LDL CALC: 71 mg/dL (ref 0–99)
TRIGLYCERIDES: 90 mg/dL (ref 0–149)
VLDL Cholesterol Cal: 18 mg/dL (ref 5–40)

## 2018-02-25 LAB — COMPREHENSIVE METABOLIC PANEL
ALBUMIN: 4.3 g/dL (ref 3.6–4.8)
ALT: 15 IU/L (ref 0–32)
AST: 25 IU/L (ref 0–40)
Albumin/Globulin Ratio: 1.8 (ref 1.2–2.2)
Alkaline Phosphatase: 63 IU/L (ref 39–117)
BUN / CREAT RATIO: 21 (ref 12–28)
BUN: 16 mg/dL (ref 8–27)
Bilirubin Total: 0.3 mg/dL (ref 0.0–1.2)
CALCIUM: 9.6 mg/dL (ref 8.7–10.3)
CO2: 25 mmol/L (ref 20–29)
CREATININE: 0.77 mg/dL (ref 0.57–1.00)
Chloride: 103 mmol/L (ref 96–106)
GFR, EST AFRICAN AMERICAN: 94 mL/min/{1.73_m2} (ref >59)
GFR, EST NON AFRICAN AMERICAN: 81 mL/min/{1.73_m2} (ref >59)
GLOBULIN, TOTAL: 2.4 g/dL (ref 1.5–4.5)
Glucose: 102 mg/dL — ABNORMAL HIGH (ref 65–99)
Potassium: 4.5 mmol/L (ref 3.5–5.2)
Sodium: 143 mmol/L (ref 134–144)
TOTAL PROTEIN: 6.7 g/dL (ref 6.0–8.5)

## 2018-02-25 MED ORDER — HYDROCHLOROTHIAZIDE 25 MG PO TABS: 25.0000 mg | ORAL_TABLET | Freq: Every day | ORAL | 1 refills | Status: DC

## 2018-02-25 MED ORDER — ROSUVASTATIN CALCIUM 10 MG PO TABS: 10.0000 mg | ORAL_TABLET | Freq: Every day | ORAL | 1 refills | Status: DC

## 2018-02-25 NOTE — Progress Notes (Signed)
Subjective:  By signing my name below, I, Andrea Morrow, attest that this documentation has been prepared under the direction and in the presence of [name], MD Electronically Signed: Ladene Artist, ED Scribe 02/25/2018 at 8:56 AM.   Patient ID: Andrea Morrow, female    DOB: 1951/08/04, 67 y.o.   MRN: [id number]  Chief Complaint  Patient presents with  . Annual Exam   HPI Andrea Morrow is a 67 y.o. female who presents to Primary Care at [facility] for a welcome to medicare physical. H/o HTN, hyperlipidemia. Pt is fasting at this visit.  HTN BP Readings from Last 3 Encounters:  02/25/18 (!) 158/82  10/07/17 110/69  08/27/17 122/72   Lab Results  Component Value Date   CREATININE 0.90 09/06/2017  HCTZ 25 mg qd. - Reports average BP of 120s/82. She has also been taking aspirin 81 mg qd x 1.5-2 yrs.  Hyperlipidemia Lab Results  Component Value Date   CHOL 167 09/06/2017   HDL 73 09/06/2017   LDLCALC 78 09/06/2017   TRIG 78 09/06/2017   CHOLHDL 2.3 09/06/2017   Lab Results  Component Value Date   ALT 20 09/06/2017   AST 26 09/06/2017   ALKPHOS 68 09/06/2017   BILITOT 0.4 09/06/2017  Overall has been controlled on Crestor 10 mg qd.  The 10-year ASCVD risk score Mikey Bussing DC Brooke Bonito., et al., 2013) is: 9.1%   Values used to calculate the score:     Age: 17 years     Sex: Female     Is Non-Hispanic African American: No     Diabetic: No     Tobacco smoker: No     Systolic Blood Pressure: 478 mmHg     Is BP treated: Yes     HDL Cholesterol: 73 mg/dL     Total Cholesterol: 167 mg/dL  CA Screening Colonoscopy: 10/07/17 Dr. Ardis Hughs, diverticulosis and 1 polyps removed. Hyperplastic polyp. Rpt in 10 yrs Breast CA Screening: 08/07/17, no evidence of malignancy Cervical CA Screening: pap 08/09/17, neg Bone Density: last done more than 2 yrs ago; does not recall when she went through menopause. No family h/o osteoporosis.  Immunizations Immunization History  Administered  Date(s) Administered  . Influenza,inj,Quad PF,6+ Mos 08/27/2017  . Pneumococcal Conjugate-13 02/25/2018  . Tdap 09/27/2014  Pneumonia: due; updated today Shingrix: [facility] currently has a waiting list  Fall Screening No falls within the past yr.  Depression Screening Depression screen [facility] 2/9 02/25/2018 08/27/2017 10/01/2016 07/23/2016 03/30/2016  Decreased Interest 0 0 0 0 0  Down, Depressed, Hopeless 0 0 0 0 0  PHQ - 2 Score 0 0 0 0 0   Functional Status Survey: Is the patient deaf or have difficulty hearing?: Yes Does the patient have difficulty seeing, even when wearing glasses/contacts?: No Does the patient have difficulty concentrating, remembering, or making decisions?: Yes Does the patient have difficulty walking or climbing stairs?: No Does the patient have difficulty dressing or bathing?: No Does the patient have difficulty doing errands alone such as visiting a doctor's office or shopping?: No Hearing: pt states that she thinks she hears fine and had a normal hearing screen at [facility] within the past yr, however, her husband always comments that she can't hear well Memory: states she forgets a lot of things but her thoughts eventually come back to her  Mental Status Screening 6CIT Screen 02/25/2018  What Year? 0 points  What month? 0 points  What time? 0 points  Count back from  20 0 points  Months in reverse 0 points  Repeat phrase 0 points  Total Score 0     Visual Acuity Screening   Right eye Left eye Both eyes  Without correction:     With correction: 20/25 20/40 20/20   Vision: Dentist: Exercise:  Advanced Directives  Information was provided.  Patient Active Problem List   Diagnosis Date Noted  . Hyperlipidemia 03/30/2016  . Essential hypertension 03/30/2016  . Other screening mammogram 01/28/2014   Past Medical History:  Diagnosis Date  . Hyperlipidemia   . Hypertension    Past Surgical History:  Procedure Laterality Date  . TUBAL LIGATION     No  Known Allergies Prior to Admission medications   Medication Sig Start Date End Date Taking? Authorizing Provider  aspirin EC 81 MG tablet Take 81 mg by mouth daily. Reported on 03/30/2016    [provider]  hydrochlorothiazide (HYDRODIURIL) 25 MG tablet Take 1 tablet (25 mg total) by mouth daily. 08/27/17   [name], MD  Multiple Vitamins-Calcium (ONE-A-DAY WOMENS PO) Take by mouth.    [provider]  rosuvastatin (CRESTOR) 10 MG tablet Take 1 tablet (10 mg total) by mouth daily. 08/27/17   [name], MD   Social History   Socioeconomic History  . Marital status: Married    Spouse name: Not on file  . Number of children: Not on file  . Years of education: Not on file  . Highest education level: Not on file  Occupational History  . Not on file  Social Needs  . Financial resource strain: Not on file  . Food insecurity:    Worry: Not on file    Inability: Not on file  . Transportation needs:    Medical: Not on file    Non-medical: Not on file  Tobacco Use  . Smoking status: Never Smoker  . Smokeless tobacco: Never Used  Substance and Sexual Activity  . Alcohol use: No    Frequency: Never  . Drug use: No  . Sexual activity: Not on file  Lifestyle  . Physical activity:    Days per week: Not on file    Minutes per session: Not on file  . Stress: Not on file  Relationships  . Social connections:    Talks on phone: Not on file    Gets together: Not on file    Attends religious service: Not on file    Active member of club or organization: Not on file    Attends meetings of clubs or organizations: Not on file    Relationship status: Not on file  . Intimate partner violence:    Fear of current or ex partner: Not on file    Emotionally abused: Not on file    Physically abused: Not on file    Forced sexual activity: Not on file  Other Topics Concern  . Not on file  Social History Narrative   Married   Clinical research associate   Review of  Systems    Objective:   Physical Exam  Constitutional: She is oriented to person, place, and time. She appears well-developed and well-nourished.  HENT:  Head: Normocephalic and atraumatic.  Right Ear: External ear normal.  Left Ear: External ear normal.  Mouth/Throat: Oropharynx is clear and moist.  Eyes: Pupils are equal, round, and reactive to light. Conjunctivae are normal.  Neck: Normal range of motion. Neck supple. No thyromegaly present.  Cardiovascular: Normal rate, regular rhythm, normal  heart sounds and intact distal pulses.  No murmur heard. Pulmonary/Chest: Effort normal and breath sounds normal. No respiratory distress. She has no wheezes.  Abdominal: Soft. Bowel sounds are normal. There is no tenderness.  Musculoskeletal: Normal range of motion. She exhibits no edema or tenderness.  Lymphadenopathy:    She has no cervical adenopathy.  Neurological: She is alert and oriented to person, place, and time.  Skin: Skin is warm and dry. No rash noted.  Psychiatric: She has a normal mood and affect. Her behavior is normal. Thought content normal.  Vitals reviewed.   Vitals:   02/25/18 0834 02/25/18 0951  BP: (!) 158/82 138/88  Pulse: 72   Temp: 97.8 F (36.6 C)   TempSrc: Oral   SpO2: 96%   Weight: 170 lb 12.8 oz (77.5 kg)   Height: 5' 1" (1.549 m)    EKG reading done by [name], MD: sinus bradycardia rate of 55. T wave inversion in lead II with fair R wave progression. Q wave in lead III appears normal in II and AVF. No prior EKG for comparison.    Assessment & Plan:    Andrea Morrow is a 67 y.o. female Welcome to Medicare preventive visit  -  - anticipatory guidance as below in AVS, screening labs if needed. Health maintenance items as above in HPI discussed/recommended as applicable.   - no concerning responses on depression, fall, or functional status screening. Any positive responses noted as above. Advanced directives discussed as in CHL.   Screening  for osteoporosis - Plan: DG Bone Density Estrogen deficiency - Plan: DG Bone Density  -Refer for bone density testing.  Need for pneumococcal vaccine - Plan: Pneumococcal conjugate vaccine 13-valent IM  -Prevnar 13 given, plan on Pneumovax future visit  Essential hypertension - Plan: Comprehensive metabolic panel, EKG 17-GYFV, hydrochlorothiazide (HYDRODIURIL) 25 MG tablet, Ambulatory referral to Cardiology  - stable on recheck. No med changes.   - possible anterior abnormality on EKG, asymptomatic.  Will refer to cardiology to determine if other testing indicated.   Mixed hyperlipidemia - Plan: rosuvastatin (CRESTOR) 10 MG tablet Hyperlipidemia, unspecified hyperlipidemia type - Plan: Comprehensive metabolic panel, Lipid panel, Ambulatory referral to Cardiology  -Tolerating Crestor at current dose, continue same, labs pending  Screening for cardiovascular condition - Plan: EKG 12-Lead Nonspecific abnormal electrocardiogram (ECG) (EKG) - Plan: Ambulatory referral to Cardiology  -As above questionable abnormality anteriorly.  Asymptomatic, but with history of hypertension hyperlipidemia we will have her evaluated by cardiology to determine if other testing or imaging is indicated.  Meds ordered this encounter  Medications  . rosuvastatin (CRESTOR) 10 MG tablet    Sig: Take 1 tablet (10 mg total) by mouth daily.    Dispense:  90 tablet    Refill:  1  . hydrochlorothiazide (HYDRODIURIL) 25 MG tablet    Sig: Take 1 tablet (25 mg total) by mouth daily.    Dispense:  90 tablet    Refill:  1   Patient Instructions   If any worsening hearing issues would recommend meeting with ear, nose and throat specialist.   If you notice any worsening memory, please return to discuss further.   Continue to ask [facility] about your shingles vaccine.  As we discussed there are some trade-offs with risks and benefits of aspirin so we will leave the option of continuing that medication up to you at this  point.  Let me know if other information is needed to help you make that decision.  I will refer you for a bone density test.  As we discussed your EKG is probably ok, but there was a borderline abnormality. I will refer you to cardiology to look at this further and determine if any other testing is needed.    Thanks for coming in today   Preventive Care 65 Years and Older, Female Preventive care refers to lifestyle choices and visits with your health care provider that can promote health and wellness. What does preventive care include?  A yearly physical exam. This is also called an annual well check.  Dental exams once or twice a year.  Routine eye exams. Ask your health care provider how often you should have your eyes checked.  Personal lifestyle choices, including: ? Daily care of your teeth and gums. ? Regular physical activity. ? Eating a healthy diet. ? Avoiding tobacco and drug use. ? Limiting alcohol use. ? Practicing safe sex. ? Taking low-dose aspirin every day. ? Taking vitamin and mineral supplements as recommended by your health care provider. What happens during an annual well check? The services and screenings done by your health care provider during your annual well check will depend on your age, overall health, lifestyle risk factors, and family history of disease. Counseling Your health care provider may ask you questions about your:  Alcohol use.  Tobacco use.  Drug use.  Emotional well-being.  Home and relationship well-being.  Sexual activity.  Eating habits.  History of falls.  Memory and ability to understand (cognition).  Work and work Statistician.  Reproductive health.  Screening You may have the following tests or measurements:  Height, weight, and BMI.  Blood pressure.  Lipid and cholesterol levels. These may be checked every 5 years, or more frequently if you are over 35 years old.  Skin check.  Lung cancer screening. You  may have this screening every year starting at age 54 if you have a 30-pack-year history of smoking and currently smoke or have quit within the past 15 years.  Fecal occult blood test (FOBT) of the stool. You may have this test every year starting at age 68.  Flexible sigmoidoscopy or colonoscopy. You may have a sigmoidoscopy every 5 years or a colonoscopy every 10 years starting at age 9.  Hepatitis C blood test.  Hepatitis B blood test.  Sexually transmitted disease (STD) testing.  Diabetes screening. This is done by checking your blood sugar (glucose) after you have not eaten for a while (fasting). You may have this done every 1-3 years.  Bone density scan. This is done to screen for osteoporosis. You may have this done starting at age 72.  Mammogram. This may be done every 1-2 years. Talk to your health care provider about how often you should have regular mammograms.  Talk with your health care provider about your test results, treatment options, and if necessary, the need for more tests. Vaccines Your health care provider may recommend certain vaccines, such as:  Influenza vaccine. This is recommended every year.  Tetanus, diphtheria, and acellular pertussis (Tdap, Td) vaccine. You may need a Td booster every 10 years.  Varicella vaccine. You may need this if you have not been vaccinated.  Zoster vaccine. You may need this after age 68.  Measles, mumps, and rubella (MMR) vaccine. You may need at least one dose of MMR if you were born in 1957 or later. You may also need a second dose.  Pneumococcal 13-valent conjugate (PCV13) vaccine. One dose is recommended after age  65.  Pneumococcal polysaccharide (PPSV23) vaccine. One dose is recommended after age 71.  Meningococcal vaccine. You may need this if you have certain conditions.  Hepatitis A vaccine. You may need this if you have certain conditions or if you travel or work in places where you may be exposed to hepatitis  A.  Hepatitis B vaccine. You may need this if you have certain conditions or if you travel or work in places where you may be exposed to hepatitis B.  Haemophilus influenzae type b (Hib) vaccine. You may need this if you have certain conditions.  Talk to your health care provider about which screenings and vaccines you need and how often you need them. This information is not intended to replace advice given to you by your health care provider. Make sure you discuss any questions you have with your health care provider. Document Released: 10/07/2015 Document Revised: 05/30/2016 Document Reviewed: 07/12/2015 Elsevier Interactive Patient Education  2018 [employer].    IF you received an x-ray today, you will receive an invoice from [facility]. Please contact [facility] at [phone] with questions or concerns regarding your invoice.   IF you received labwork today, you will receive an invoice from [facility]. Please contact [facility] at [phone] with questions or concerns regarding your invoice.   Our billing staff will not be able to assist you with questions regarding bills from these companies.  You will be contacted with the lab results as soon as they are available. The fastest way to get your results is to activate your My Chart account. Instructions are located on the last page of this paperwork. If you have not heard from [place] regarding the results in 2 weeks, please contact this office.       I personally performed the services described in this documentation, which was scribed in my presence. The recorded information has been reviewed and considered for accuracy and completeness, addended by me as needed, and agree with information above.  Signed,   [name], MD Primary [facility] at [facility].  02/25/18 9:57 AM

## 2018-02-25 NOTE — Patient Instructions (Addendum)
If any worsening hearing issues would recommend meeting with ear, nose and throat specialist.   If you notice any worsening memory, please return to discuss further.   Continue to ask [facility] about your shingles vaccine.  As we discussed there are some trade-offs with risks and benefits of aspirin so we will leave the option of continuing that medication up to you at this point.  Let me know if other information is needed to help you make that decision.  I will refer you for a bone density test.  As we discussed your EKG is probably ok, but there was a borderline abnormality. I will refer you to cardiology to look at this further and determine if any other testing is needed.    Thanks for coming in today   Preventive Care 65 Years and Older, Female Preventive care refers to lifestyle choices and visits with your health care provider that can promote health and wellness. What does preventive care include?  A yearly physical exam. This is also called an annual well check.  Dental exams once or twice a year.  Routine eye exams. Ask your health care provider how often you should have your eyes checked.  Personal lifestyle choices, including: ? Daily care of your teeth and gums. ? Regular physical activity. ? Eating a healthy diet. ? Avoiding tobacco and drug use. ? Limiting alcohol use. ? Practicing safe sex. ? Taking low-dose aspirin every day. ? Taking vitamin and mineral supplements as recommended by your health care provider. What happens during an annual well check? The services and screenings done by your health care provider during your annual well check will depend on your age, overall health, lifestyle risk factors, and family history of disease. Counseling Your health care provider may ask you questions about your:  Alcohol use.  Tobacco use.  Drug use.  Emotional well-being.  Home and relationship well-being.  Sexual activity.  Eating habits.  History of  falls.  Memory and ability to understand (cognition).  Work and work Statistician.  Reproductive health.  Screening You may have the following tests or measurements:  Height, weight, and BMI.  Blood pressure.  Lipid and cholesterol levels. These may be checked every 5 years, or more frequently if you are over 71 years old.  Skin check.  Lung cancer screening. You may have this screening every year starting at age 66 if you have a 30-pack-year history of smoking and currently smoke or have quit within the past 15 years.  Fecal occult blood test (FOBT) of the stool. You may have this test every year starting at age 22.  Flexible sigmoidoscopy or colonoscopy. You may have a sigmoidoscopy every 5 years or a colonoscopy every 10 years starting at age 24.  Hepatitis C blood test.  Hepatitis B blood test.  Sexually transmitted disease (STD) testing.  Diabetes screening. This is done by checking your blood sugar (glucose) after you have not eaten for a while (fasting). You may have this done every 1-3 years.  Bone density scan. This is done to screen for osteoporosis. You may have this done starting at age 35.  Mammogram. This may be done every 1-2 years. Talk to your health care provider about how often you should have regular mammograms.  Talk with your health care provider about your test results, treatment options, and if necessary, the need for more tests. Vaccines Your health care provider may recommend certain vaccines, such as:  Influenza vaccine. This is recommended every year.  Tetanus, diphtheria, and acellular pertussis (Tdap, Td) vaccine. You may need a Td booster every 10 years.  Varicella vaccine. You may need this if you have not been vaccinated.  Zoster vaccine. You may need this after age 79.  Measles, mumps, and rubella (MMR) vaccine. You may need at least one dose of MMR if you were born in 1957 or later. You may also need a second dose.  Pneumococcal  13-valent conjugate (PCV13) vaccine. One dose is recommended after age 45.  Pneumococcal polysaccharide (PPSV23) vaccine. One dose is recommended after age 10.  Meningococcal vaccine. You may need this if you have certain conditions.  Hepatitis A vaccine. You may need this if you have certain conditions or if you travel or work in places where you may be exposed to hepatitis A.  Hepatitis B vaccine. You may need this if you have certain conditions or if you travel or work in places where you may be exposed to hepatitis B.  Haemophilus influenzae type b (Hib) vaccine. You may need this if you have certain conditions.  Talk to your health care provider about which screenings and vaccines you need and how often you need them. This information is not intended to replace advice given to you by your health care provider. Make sure you discuss any questions you have with your health care provider. Document Released: 10/07/2015 Document Revised: 05/30/2016 Document Reviewed: 07/12/2015 Elsevier Interactive Patient Education  2018 [employer].    IF you received an x-ray today, you will receive an invoice from [facility] Radiology. Please contact [facility] at [phone] with questions or concerns regarding your invoice.   IF you received labwork today, you will receive an invoice from [facility]. Please contact [facility] at [phone] with questions or concerns regarding your invoice.   Our billing staff will not be able to assist you with questions regarding bills from these companies.  You will be contacted with the lab results as soon as they are available. The fastest way to get your results is to activate your My Chart account. Instructions are located on the last page of this paperwork. If you have not heard from [place] regarding the results in 2 weeks, please contact this office.

## 2018-03-15 ENCOUNTER — Encounter: Payer: Self-pay | Admitting: Radiology

## 2018-04-01 NOTE — Progress Notes (Signed)
Cardiology Office Note:    Date:  04/02/2018   ID:  Andrea Morrow, DOB [birth year], MRN [id number]  PCP:  [name], MD  Cardiologist:  Buford Dresser, MD PhD  Referring MD: [name], MD   Chief Complaint  Patient presents with  . New Patient (Initial Visit)    referred by Dr Carlota Raspberry office for HTN     History of Present Illness:    Andrea Morrow is a 67 y.o. female with a hx of hypertension, hyperlipidemia who is seen as a new consult at the request of Dr. [name] for evaluation and management of hypertension, hyperlipidemia and EKG abnormality.  Patient overall feeling well. She was under the impression that she may be getting an echocardiogram today. Her concern is for her cardiac risk and if there are any abnormalities she should know about.  Never had chest pain. Gets short of breath walking about a mile but can continue on. Can climb 6 flights of stairs though is winded at the top. Syncope once 38 years ago when pregnant, otherwise none. Leg edema with plane flight or long distance driving, otherwise none. No PND or orthopnea. Does snore but no gasping.  Father died age 55 from MI. Mother died age 69 with breast cancer. Brother had MI in his 67s, is 39 now. Sister is healthy.  Past Medical History:  Diagnosis Date  . Hyperlipidemia   . Hypertension     Past Surgical History:  Procedure Laterality Date  . TUBAL LIGATION      Current Medications: Current Meds  Medication Sig  . aspirin EC 81 MG tablet Take 81 mg by mouth daily. Reported on 03/30/2016  . hydrochlorothiazide (HYDRODIURIL) 25 MG tablet Take 1 tablet (25 mg total) by mouth daily.  . Multiple Vitamins-Calcium (ONE-A-DAY WOMENS PO) Take by mouth.  . rosuvastatin (CRESTOR) 10 MG tablet Take 1 tablet (10 mg total) by mouth daily.    Current Outpatient Medications on File Prior to Visit  Medication Sig  . aspirin EC 81 MG tablet Take 81 mg by mouth daily. Reported on 03/30/2016  .  hydrochlorothiazide (HYDRODIURIL) 25 MG tablet Take 1 tablet (25 mg total) by mouth daily.  . Multiple Vitamins-Calcium (ONE-A-DAY WOMENS PO) Take by mouth.  . rosuvastatin (CRESTOR) 10 MG tablet Take 1 tablet (10 mg total) by mouth daily.   No current facility-administered medications on file prior to visit.     Allergies:   Patient has no known allergies.   Social History   Socioeconomic History  . Marital status: Married    Spouse name: Not on file  . Number of children: Not on file  . Years of education: Not on file  . Highest education level: Not on file  Occupational History  . Not on file  Social Needs  . Financial resource strain: Not on file  . Food insecurity:    Worry: Not on file    Inability: Not on file  . Transportation needs:    Medical: Not on file    Non-medical: Not on file  Tobacco Use  . Smoking status: Never Smoker  . Smokeless tobacco: Never Used  Substance and Sexual Activity  . Alcohol use: No    Frequency: Never  . Drug use: No  . Sexual activity: Not on file  Lifestyle  . Physical activity:    Days per week: Not on file    Minutes per session: Not on file  . Stress: Not on file  Relationships  . Social connections:    Talks on phone: Not on file    Gets together: Not on file    Attends religious service: Not on file    Active member of club or organization: Not on file    Attends meetings of clubs or organizations: Not on file    Relationship status: Not on file  Other Topics Concern  . Not on file  Social History Narrative   Married   Clinical research associate     Family History: The patient's family history includes Cancer in her mother; Heart disease in her brother; Hyperlipidemia in her mother. There is no history of Colon cancer, Esophageal cancer, Pancreatic cancer, Rectal cancer, or Stomach cancer. Father died age 109 from MI. Mother died age 47 with breast cancer. Brother had MI in his 22s, is 46 now. Sister is healthy.  ROS:     Please see the history of present illness.   Review of Systems  Constitutional: Negative for chills, diaphoresis, fever, malaise/fatigue and weight loss.  HENT: Negative for hearing loss and tinnitus.   Eyes: Negative for blurred vision and double vision.  Respiratory: Negative for cough, shortness of breath and wheezing.   Cardiovascular: Negative for chest pain, palpitations, orthopnea, claudication, leg swelling and PND.  Gastrointestinal: Negative for abdominal pain, blood in stool and melena.  Genitourinary: Negative for dysuria and hematuria.  Musculoskeletal: Negative for myalgias.  Skin: Negative for itching and rash.  Neurological: Negative for focal weakness, loss of consciousness and headaches.  Endo/Heme/Allergies: Does not bruise/bleed easily.   EKGs/Labs/Other Studies Reviewed:    The following studies were reviewed today: Prior EKG: sinus bradycardia  EKG:  EKG is ordered today.  The ekg ordered today demonstrates normal sinus rhythm  Recent Labs: 02/25/2018: ALT 15; BUN 16; Creatinine, Ser 0.77; Potassium 4.5; Sodium 143  Recent Lipid Panel    Component Value Date/Time   CHOL 157 02/25/2018 1146   TRIG 90 02/25/2018 1146   HDL 68 02/25/2018 1146   CHOLHDL 2.3 02/25/2018 1146   CHOLHDL 3.4 03/30/2016 1704   VLDL 25 03/30/2016 1704   LDLCALC 71 02/25/2018 1146    Physical Exam:    VS:  BP 122/80   Pulse 63   Ht 5\' 1"  (1.549 m)   Wt 170 lb 3.2 oz (77.2 kg)   BMI 32.16 kg/m     Wt Readings from Last 3 Encounters:  04/02/18 170 lb 3.2 oz (77.2 kg)  02/25/18 170 lb 12.8 oz (77.5 kg)  10/07/17 168 lb (76.2 kg)     GEN: Well nourished, well developed in no acute distress HEENT: Normal NECK: No JVD; No carotid bruits LYMPHATICS: No lymphadenopathy CARDIAC: regular rhythm, normal S1 and S2, no murmurs, rubs, gallops RESPIRATORY:  Clear to auscultation without rales, wheezing or rhonchi  ABDOMEN: Soft, non-tender, non-distended MUSCULOSKELETAL:  No edema;  No deformity  SKIN: Warm and dry NEUROLOGIC:  Alert and oriented x 3 PSYCHIATRIC:  Normal affect   ASSESSMENT:    1. Essential hypertension   2. Hyperlipidemia, unspecified hyperlipidemia type   3. Counseling on health promotion and disease prevention   4. Abnormal EKG    PLAN:    In order of problems listed above:  1. Cardiovascular risk and prevention ASCVD 10 year risk today is 5.5%. This is with well managed lipids on a statin and well managed blood pressure. Her risk would be higher without these. She also has a significant family history of heart disease in  first degree female relatives that is not accounted for in the calculations. -would continue statin, this is well tolerated -discussed aspirin recommendations at length. Prior USPSTF recommendation was for aspirin for stroke prevention in women older than age 21; however, aspirin recommendations are in flux. She does not have any evidence of GI bleeding or stomach upset, and no easy bruising. The use of 81 mg aspirin is a personal choice; she could stop if desired. However, as it is well tolerated and she does have an extensive family history, it would be reasonable to continue. She will think about this but will definitely continue the statin.  We discussed nutrition and exercise. She does have her daughter and newborn granddaughter living with her currently, and her intentional activity has decreased.  I suggest walking at least 30 minutes three to five times per week at a pace of 3 miles/hr (and therefore walking 1.5 miles in 30 minutes). This is the target; it may take time to build up to this. Studies have shown that this amount of walking decreases the changes of hospitalization or death, especially as people age.  2. Abnormal ECG: has Q in III, but this is not pathologic as there is no Q in II or aVF. Normal sinus rhythm. No symptoms, no further workup indicated.  3. Hypertension, hyperlipidemia: well managed, continue  current regimen.  She can follow up with me as needed.  Medication Adjustments/Labs and Tests Ordered: Current medicines are reviewed at length with the patient today.  Concerns regarding medicines are outlined above.  Orders Placed This Encounter  Procedures  . EKG 12-Lead   No orders of the defined types were placed in this encounter.   Patient Instructions  Medication Instructions: Your physician recommends that you continue on your current medications as directed.    If you need a refill on your cardiac medications before your next appointment, please call your pharmacy.   Labwork: None  Procedures/Testing: None  Follow-Up: Your physician wants you to follow-up as needed with Dr. Harrell Gave. Call our office at [phone] to schedule this follow-up appointment.   Special Instructions:    Thank you for choosing [facility] at [facility]!!       Signed, Buford Dresser, MD PhD 04/02/2018 10:26 AM    [place]

## 2018-04-02 ENCOUNTER — Encounter: Payer: Self-pay | Admitting: Cardiology

## 2018-04-02 ENCOUNTER — Ambulatory Visit: Payer: Medicare HMO | Admitting: Cardiology

## 2018-04-02 VITALS — BP 122/80 | HR 63 | Ht 61.0 in | Wt 170.2 lb

## 2018-04-02 DIAGNOSIS — E785 Hyperlipidemia, unspecified: Secondary | ICD-10-CM

## 2018-04-02 DIAGNOSIS — Z7189 Other specified counseling: Secondary | ICD-10-CM | POA: Diagnosis not present

## 2018-04-02 DIAGNOSIS — R9431 Abnormal electrocardiogram [ECG] [EKG]: Secondary | ICD-10-CM

## 2018-04-02 DIAGNOSIS — I10 Essential (primary) hypertension: Secondary | ICD-10-CM | POA: Diagnosis not present

## 2018-04-02 NOTE — Patient Instructions (Signed)
Medication Instructions: Your physician recommends that you continue on your current medications as directed.    If you need a refill on your cardiac medications before your next appointment, please call your pharmacy.   Labwork: None  Procedures/Testing: None  Follow-Up: Your physician wants you to follow-up as needed with Dr. Harrell Gave. Call our office at [phone] to schedule this follow-up appointment.   Special Instructions:    Thank you for choosing [facility] at [facility]!!

## 2018-04-14 ENCOUNTER — Ambulatory Visit
Admission: RE | Admit: 2018-04-14 | Discharge: 2018-04-14 | Disposition: A | Discharge status: Home / Self Care | Payer: Medicare HMO | Source: Ambulatory Visit | Attending: Family Medicine | Admitting: Family Medicine

## 2018-04-14 DIAGNOSIS — E2839 Other primary ovarian failure: Secondary | ICD-10-CM

## 2018-04-14 DIAGNOSIS — Z13820 Encounter for screening for osteoporosis: Secondary | ICD-10-CM

## 2018-04-14 DIAGNOSIS — Z780 Asymptomatic menopausal state: Secondary | ICD-10-CM | POA: Diagnosis not present

## 2018-05-13 DIAGNOSIS — R69 Illness, unspecified: Secondary | ICD-10-CM | POA: Diagnosis not present

## 2018-07-30 DIAGNOSIS — R69 Illness, unspecified: Secondary | ICD-10-CM | POA: Diagnosis not present

## 2018-08-18 DIAGNOSIS — R69 Illness, unspecified: Secondary | ICD-10-CM | POA: Diagnosis not present

## 2018-08-20 DIAGNOSIS — Z1231 Encounter for screening mammogram for malignant neoplasm of breast: Secondary | ICD-10-CM | POA: Diagnosis not present

## 2018-08-28 ENCOUNTER — Ambulatory Visit: Payer: Medicare HMO | Admitting: Family Medicine

## 2018-08-29 ENCOUNTER — Ambulatory Visit: Payer: Medicare HMO | Admitting: Family Medicine

## 2018-09-03 ENCOUNTER — Other Ambulatory Visit: Payer: Self-pay | Admitting: Family Medicine

## 2018-09-03 DIAGNOSIS — I10 Essential (primary) hypertension: Secondary | ICD-10-CM

## 2018-09-04 NOTE — Telephone Encounter (Signed)
90 day courtesy refill given. Appt scheduled on 09/08/18

## 2018-09-06 ENCOUNTER — Other Ambulatory Visit: Payer: Self-pay | Admitting: Family Medicine

## 2018-09-06 DIAGNOSIS — E782 Mixed hyperlipidemia: Secondary | ICD-10-CM

## 2018-09-08 ENCOUNTER — Ambulatory Visit (INDEPENDENT_AMBULATORY_CARE_PROVIDER_SITE_OTHER): Payer: Medicare HMO | Admitting: Family Medicine

## 2018-09-08 ENCOUNTER — Encounter: Payer: Self-pay | Admitting: Family Medicine

## 2018-09-08 ENCOUNTER — Other Ambulatory Visit: Payer: Self-pay

## 2018-09-08 VITALS — BP 131/84 | HR 66 | Temp 98.0°F | Resp 14 | Ht 62.0 in | Wt 169.6 lb

## 2018-09-08 DIAGNOSIS — I10 Essential (primary) hypertension: Secondary | ICD-10-CM | POA: Diagnosis not present

## 2018-09-08 DIAGNOSIS — E785 Hyperlipidemia, unspecified: Secondary | ICD-10-CM

## 2018-09-08 MED ORDER — HYDROCHLOROTHIAZIDE 25 MG PO TABS: 25.0000 mg | ORAL_TABLET | Freq: Every day | ORAL | 3 refills | Status: DC

## 2018-09-08 MED ORDER — ROSUVASTATIN CALCIUM 10 MG PO TABS: 10.0000 mg | ORAL_TABLET | Freq: Every day | ORAL | 3 refills | Status: DC

## 2018-09-08 NOTE — Patient Instructions (Addendum)
Thanks for coming in today.  No changes for now. Recheck in 6 months.    If you have lab work done today you will be contacted with your lab results within the next 2 weeks.  If you have not heard from [place] then please contact us. The fastest way to get your results is to register for My Chart.   IF you received an x-ray today, you will receive an invoice from [facility]. Please contact [facility] at [phone] with questions or concerns regarding your invoice.   IF you received labwork today, you will receive an invoice from [place]. Please contact [facility] at [phone] with questions or concerns regarding your invoice.   Our billing staff will not be able to assist you with questions regarding bills from these companies.  You will be contacted with the lab results as soon as they are available. The fastest way to get your results is to activate your My Chart account. Instructions are located on the last page of this paperwork. If you have not heard from [place] regarding the results in 2 weeks, please contact this office.

## 2018-09-08 NOTE — Telephone Encounter (Signed)
Requested medication (s) are due for refill today: yes  Requested medication (s) are on the active medication list: yes  Last refill:  02/25/18  Future visit scheduled: yes 09/08/18  Notes to clinic:  Pt is scheduled for a cholesterol check today (6 months).  Requested Prescriptions  Pending Prescriptions Disp Refills   rosuvastatin (CRESTOR) 10 MG tablet [Pharmacy Med Name: Rosuvastatin Calcium Oral Tablet 10 MG] 90 tablet 0    Sig: TAKE ONE TABLET BY MOUTH ONE TIME DAILY     Cardiovascular:  Antilipid - Statins Passed - 09/08/2018  7:55 AM      Passed - Total Cholesterol in normal range and within 360 days    Cholesterol, Total  Date Value Ref Range Status  02/25/2018 157 100 - 199 mg/dL Final         Passed - LDL in normal range and within 360 days    LDL Calculated  Date Value Ref Range Status  02/25/2018 71 0 - 99 mg/dL Final         Passed - HDL in normal range and within 360 days    HDL  Date Value Ref Range Status  02/25/2018 68 >39 mg/dL Final         Passed - Triglycerides in normal range and within 360 days    Triglycerides  Date Value Ref Range Status  02/25/2018 90 0 - 149 mg/dL Final         Passed - Patient is not pregnant      Passed - Valid encounter within last 12 months    Recent Outpatient Visits          6 months ago Welcome to [employer] preventive visit   [facility] [place], Ranell Patrick, MD   1 year ago Essential hypertension   [facility] [place] [facility], Ranell Patrick, MD   1 year ago Annual physical exam   [facility] [place], MD   2 years ago Bronchospasm with bronchitis, acute   [facility], Arlie Solomons, MD   2 years ago Hyperlipidemia   [facility], Ranell Patrick, MD      Future Appointments            Today [name], MD [facility] [place], [facility]

## 2018-09-08 NOTE — Progress Notes (Signed)
Subjective:    Patient ID: Andrea Morrow, female    DOB: 04-14-1951, 67 y.o.   MRN: [id number]  HPI Andrea Morrow is a 67 y.o. female Presents today for: Chief Complaint  Patient presents with  . Hyperlipidemia    need refill crestor  . Hypertension   Hyperlipidemia:  Lab Results  Component Value Date   CHOL 157 02/25/2018   HDL 68 02/25/2018   LDLCALC 71 02/25/2018   TRIG 90 02/25/2018   CHOLHDL 2.3 02/25/2018   Lab Results  Component Value Date   ALT 15 02/25/2018   AST 25 02/25/2018   ALKPHOS 63 02/25/2018   BILITOT 0.3 02/25/2018  on crestor 10mg  qd. No new myalgias or other side effects.  Fasting today.  Hypertension: BP Readings from Last 3 Encounters:  09/08/18 131/84  04/02/18 122/80  02/25/18 138/88   Lab Results  Component Value Date   CREATININE 0.77 02/25/2018  home BP 120-130 at home.  meds working.  No new side effects with HCTZ 25mg  QD.     Patient Active Problem List   Diagnosis Date Noted  . Hyperlipidemia 03/30/2016  . Essential hypertension 03/30/2016  . Other screening mammogram 01/28/2014   Past Medical History:  Diagnosis Date  . Hyperlipidemia   . Hypertension    Past Surgical History:  Procedure Laterality Date  . TUBAL LIGATION     No Known Allergies Prior to Admission medications   Medication Sig Start Date End Date Taking? Authorizing Provider  hydrochlorothiazide (HYDRODIURIL) 25 MG tablet TAKE ONE TABLET BY MOUTH ONE TIME DAILY  09/04/18  Yes [name], MD  Multiple Vitamins-Calcium (ONE-A-DAY WOMENS PO) Take by mouth.   Yes [provider]  rosuvastatin (CRESTOR) 10 MG tablet TAKE ONE TABLET BY MOUTH ONE TIME DAILY  09/08/18   [name], MD   Social History   Socioeconomic History  . Marital status: Married    Spouse name: Not on file  . Number of children: Not on file  . Years of education: Not on file  . Highest education level: Not on file  Occupational History  . Not on file    Social Needs  . Financial resource strain: Not on file  . Food insecurity:    Worry: Not on file    Inability: Not on file  . Transportation needs:    Medical: Not on file    Non-medical: Not on file  Tobacco Use  . Smoking status: Never Smoker  . Smokeless tobacco: Never Used  Substance and Sexual Activity  . Alcohol use: No    Frequency: Never  . Drug use: No  . Sexual activity: Not on file  Lifestyle  . Physical activity:    Days per week: Not on file    Minutes per session: Not on file  . Stress: Not on file  Relationships  . Social connections:    Talks on phone: Not on file    Gets together: Not on file    Attends religious service: Not on file    Active member of club or organization: Not on file    Attends meetings of clubs or organizations: Not on file    Relationship status: Not on file  . Intimate partner violence:    Fear of current or ex partner: Not on file    Emotionally abused: Not on file    Physically abused: Not on file    Forced sexual activity: Not on file  Other Topics  Concern  . Not on file  Social History Narrative   Married   Clinical research associate    Review of Systems  Constitutional: Negative for fatigue and unexpected weight change.  Respiratory: Negative for chest tightness and shortness of breath.   Cardiovascular: Negative for chest pain, palpitations and leg swelling.  Gastrointestinal: Negative for abdominal pain and blood in stool.  Neurological: Negative for dizziness (only rare with stainding up quickly. ), syncope, light-headedness and headaches.       Objective:   Physical Exam Vitals signs reviewed.  Constitutional:      Appearance: She is well-developed.  HENT:     Head: Normocephalic and atraumatic.  Eyes:     Conjunctiva/sclera: Conjunctivae normal.     Pupils: Pupils are equal, round, and reactive to light.  Neck:     Vascular: No carotid bruit.  Cardiovascular:     Rate and Rhythm: Normal rate and regular rhythm.      Heart sounds: Normal heart sounds.  Pulmonary:     Effort: Pulmonary effort is normal.     Breath sounds: Normal breath sounds.  Abdominal:     Palpations: Abdomen is soft. There is no pulsatile mass.     Tenderness: There is no abdominal tenderness.  Skin:    General: Skin is warm and dry.  Neurological:     Mental Status: She is alert and oriented to person, place, and time.  Psychiatric:        Behavior: Behavior normal.    Vitals:   09/08/18 0849  BP: 131/84  Pulse: 66  Resp: 14  Temp: 98 F (36.7 C)  TempSrc: Oral  SpO2: 98%  Weight: 169 lb 9.6 oz (76.9 kg)  Height: 5\' 2"  (1.575 m)       Assessment & Plan:   Andrea Morrow is a 67 y.o. female Hyperlipidemia, unspecified hyperlipidemia type - Plan: Lipid panel, Comprehensive metabolic panel  -  Stable, tolerating current regimen. Medications refilled. Labs pending as above.   Essential hypertension - Plan: hydrochlorothiazide (HYDRODIURIL) 25 MG tablet  -  Stable, tolerating current regimen. Medications refilled. Labs pending as above.   -Discussed maintaining hydration to lessen risk of dizziness.  If persistent dizziness, consider medication changes.  Monitor home BPs, RTC precautions.   Meds ordered this encounter  Medications  . rosuvastatin (CRESTOR) 10 MG tablet    Sig: Take 1 tablet (10 mg total) by mouth daily.    Dispense:  90 tablet    Refill:  3  . hydrochlorothiazide (HYDRODIURIL) 25 MG tablet    Sig: Take 1 tablet (25 mg total) by mouth daily.    Dispense:  90 tablet    Refill:  3   Patient Instructions   Thanks for coming in today.  No changes for now. Recheck in 6 months.    If you have lab work done today you will be contacted with your lab results within the next 2 weeks.  If you have not heard from [place] then please contact us. The fastest way to get your results is to register for My Chart.   IF you received an x-ray today, you will receive an invoice from [facility]. Please  contact [facility] at [phone] with questions or concerns regarding your invoice.   IF you received labwork today, you will receive an invoice from [place]. Please contact [facility] at [phone] with questions or concerns regarding your invoice.   Our billing staff will not be able to assist  you with questions regarding bills from these companies.  You will be contacted with the lab results as soon as they are available. The fastest way to get your results is to activate your My Chart account. Instructions are located on the last page of this paperwork. If you have not heard from [place] regarding the results in 2 weeks, please contact this office.       Signed,   [name], MD [facility] at [place].  09/08/18 9:45 AM

## 2018-09-09 LAB — COMPREHENSIVE METABOLIC PANEL
A/G RATIO: 1.8 (ref 1.2–2.2)
ALBUMIN: 4.5 g/dL (ref 3.6–4.8)
ALT: 15 IU/L (ref 0–32)
AST: 23 IU/L (ref 0–40)
Alkaline Phosphatase: 66 IU/L (ref 39–117)
BUN/Creatinine Ratio: 17 (ref 12–28)
BUN: 14 mg/dL (ref 8–27)
Bilirubin Total: 0.4 mg/dL (ref 0.0–1.2)
CO2: 23 mmol/L (ref 20–29)
Calcium: 9.9 mg/dL (ref 8.7–10.3)
Chloride: 101 mmol/L (ref 96–106)
Creatinine, Ser: 0.84 mg/dL (ref 0.57–1.00)
GFR calc Af Amer: 84 mL/min/{1.73_m2} (ref >59)
GFR calc non Af Amer: 73 mL/min/{1.73_m2} (ref >59)
Globulin, Total: 2.5 g/dL (ref 1.5–4.5)
Glucose: 97 mg/dL (ref 65–99)
Potassium: 4.2 mmol/L (ref 3.5–5.2)
Sodium: 142 mmol/L (ref 134–144)
Total Protein: 7 g/dL (ref 6.0–8.5)

## 2018-09-09 LAB — LIPID PANEL
Chol/HDL Ratio: 2.5 ratio (ref 0.0–4.4)
Cholesterol, Total: 192 mg/dL (ref 100–199)
HDL: 76 mg/dL (ref >39)
LDL Calculated: 93 mg/dL (ref 0–99)
Triglycerides: 114 mg/dL (ref 0–149)
VLDL Cholesterol Cal: 23 mg/dL (ref 5–40)

## 2018-10-20 ENCOUNTER — Encounter: Payer: Self-pay | Admitting: Emergency Medicine

## 2018-10-20 ENCOUNTER — Ambulatory Visit
Admission: EM | Admit: 2018-10-20 | Discharge: 2018-10-20 | Disposition: A | Discharge status: Home / Self Care | Payer: Medicare HMO | Attending: Family Medicine | Admitting: Family Medicine

## 2018-10-20 DIAGNOSIS — L237 Allergic contact dermatitis due to plants, except food: Secondary | ICD-10-CM | POA: Diagnosis not present

## 2018-10-20 MED ORDER — METHYLPREDNISOLONE 4 MG PO TBPK: ORAL_TABLET | ORAL | 0 refills | Status: DC

## 2018-10-20 NOTE — ED Triage Notes (Signed)
Pt states she has poison ivy on bilateral arms x1 week, not getting better, states its getting worse.

## 2018-10-20 NOTE — ED Provider Notes (Signed)
[facility]    CSN: [id number] Arrival date & time: 10/20/18  1000     History   Chief Complaint Chief Complaint  Patient presents with  . Rash    HPI Andrea Morrow is a 68 y.o. female.   HPI  Patient states she did a lot of yard work.  She was pulling on vines.  She did not realize it was poison ivy.  She now has a rash on both of her forearms.  It spreading to her abdomen and knots on her face.  She states that she has been using over-the-counter poison ivy products.  Cortisone cream.  She took Benadryl.  It still itching, spreading, she is very sensitive to this kind of rash.  She states in the past her doctors given her prednisone with good results  Past Medical History:  Diagnosis Date  . Hyperlipidemia   . Hypertension     Patient Active Problem List   Diagnosis Date Noted  . Hyperlipidemia 03/30/2016  . Essential hypertension 03/30/2016  . Other screening mammogram 01/28/2014    Past Surgical History:  Procedure Laterality Date  . TUBAL LIGATION      OB History   No obstetric history on file.      Home Medications    Prior to Admission medications   Medication Sig Start Date End Date Taking? Authorizing Provider  hydrochlorothiazide (HYDRODIURIL) 25 MG tablet Take 1 tablet (25 mg total) by mouth daily. 09/08/18   [name], MD  methylPREDNISolone (MEDROL DOSEPAK) 4 MG TBPK tablet tad 10/20/18   Raylene Everts, MD  Multiple Vitamins-Calcium (ONE-A-DAY WOMENS PO) Take by mouth.    [provider]  rosuvastatin (CRESTOR) 10 MG tablet Take 1 tablet (10 mg total) by mouth daily. 09/08/18   [name], MD    Family History Family History  Problem Relation Age of Onset  . Cancer Mother   . Hyperlipidemia Mother   . Heart disease Brother   . Colon cancer Neg Hx   . Esophageal cancer Neg Hx   . Pancreatic cancer Neg Hx   . Rectal cancer Neg Hx   . Stomach cancer Neg Hx     Social History Social History    Tobacco Use  . Smoking status: Never Smoker  . Smokeless tobacco: Never Used  Substance Use Topics  . Alcohol use: No    Frequency: Never  . Drug use: No     Allergies   Patient has no known allergies.   Review of Systems Review of Systems  Constitutional: Negative for chills and fever.  HENT: Negative for ear pain and sore throat.   Eyes: Negative for pain and visual disturbance.  Respiratory: Negative for cough and shortness of breath.   Cardiovascular: Negative for chest pain and palpitations.  Gastrointestinal: Negative for abdominal pain and vomiting.  Genitourinary: Negative for dysuria and hematuria.  Musculoskeletal: Negative for arthralgias and back pain.  Skin: Positive for rash. Negative for color change.  Neurological: Negative for seizures and syncope.  All other systems reviewed and are negative.    Physical Exam Triage Vital Signs ED Triage Vitals  Enc Vitals Group     BP 10/20/18 1011 (!) 159/88     Pulse Rate 10/20/18 1011 73     Resp 10/20/18 1011 16     Temp 10/20/18 1011 98 F (36.7 C)     Temp src --      SpO2 10/20/18 1011 95 %  Weight --      Height --      Head Circumference --      Peak Flow --      Pain Score 10/20/18 1012 2     Pain Loc --      Pain Edu? --      Excl. in [place]? --    No data found.  Updated Vital Signs BP (!) 159/88   Pulse 73   Temp 98 F (36.7 C)   Resp 16   SpO2 95%   Visual Acuity Right Eye Distance:   Left Eye Distance:   Bilateral Distance:    Right Eye Near:   Left Eye Near:    Bilateral Near:     Physical Exam Constitutional:      General: She is not in acute distress.    Appearance: She is well-developed.  HENT:     Head: Normocephalic and atraumatic.  Eyes:     Conjunctiva/sclera: Conjunctivae normal.     Pupils: Pupils are equal, round, and reactive to light.  Neck:     Musculoskeletal: Normal range of motion.  Cardiovascular:     Rate and Rhythm: Normal rate.  Pulmonary:      Effort: Pulmonary effort is normal. No respiratory distress.  Abdominal:     General: There is no distension.     Palpations: Abdomen is soft.  Musculoskeletal: Normal range of motion.  Skin:    General: Skin is warm and dry.     Findings: Rash present.     Comments: Patient has patches of vesicular rash on erythematous base, some excoriated.  Is on both forearms right is less than the left.  Also up the left side of her neck and onto her cheek.  Few patches across lower abdomen.  None of it looks infected, weepy, cellulitis.  Neurological:     General: No focal deficit present.     Mental Status: She is alert. Mental status is at baseline.  Psychiatric:        Mood and Affect: Mood normal.        Thought Content: Thought content normal.      UC Treatments / Results  Labs (all labs ordered are listed, but only abnormal results are displayed) Labs Reviewed - No data to display  EKG None  Radiology No results found.  Procedures Procedures (including critical care time)  Medications Ordered in UC Medications - No data to display  Initial Impression / Assessment and Plan / UC Course  I have reviewed the triage vital signs and the nursing notes.  Pertinent labs & imaging results that were available during my care of the patient were reviewed by me and considered in my medical decision making (see chart for details).     Discussed natural course of poison ivy. Final Clinical Impressions(s) / UC Diagnoses   Final diagnoses:  Allergic contact dermatitis due to plants, except food     Discharge Instructions     Take the medrol pak as directed Take all of day one today May take zyrtec ( cetirizine ) for the itching This is OTC Expect improvement over the next few days   ED Prescriptions    Medication Sig Dispense Auth. Provider   methylPREDNISolone (MEDROL DOSEPAK) 4 MG TBPK tablet tad 21 tablet Raylene Everts, MD     Controlled Substance Prescriptions [place]  Controlled Substance Registry consulted? Not Applicable   Raylene Everts, MD 10/20/18 [phone]

## 2018-10-20 NOTE — Discharge Instructions (Signed)
Take the medrol pak as directed Take all of day one today May take zyrtec ( cetirizine ) for the itching This is OTC Expect improvement over the next few days

## 2018-10-21 ENCOUNTER — Ambulatory Visit: Payer: Medicare HMO | Admitting: Family Medicine

## 2018-11-12 ENCOUNTER — Telehealth: Payer: Self-pay | Admitting: Family Medicine

## 2018-11-12 NOTE — Telephone Encounter (Signed)
mychart message sent to pt about julie calling to reschedule AWV

## 2019-01-20 ENCOUNTER — Telehealth: Payer: Medicare HMO | Admitting: Physician Assistant

## 2019-01-20 DIAGNOSIS — W57XXXA Bitten or stung by nonvenomous insect and other nonvenomous arthropods, initial encounter: Secondary | ICD-10-CM

## 2019-01-20 DIAGNOSIS — R21 Rash and other nonspecific skin eruption: Secondary | ICD-10-CM

## 2019-01-20 NOTE — Progress Notes (Signed)
Thank you for describing your tick bite, Here is how we plan to help! Based on the information that you shared with me it looks like you have An uncomplicated tick bite that just occurred and can be closely follow using the instructions in your care plan.  In most cases a tick bite is painless and does not itch.  Most tick bites in which the tick is quickly removed do not require prescriptions. Ticks can transmit several diseases if they are infected and remain attacked to your skin. Therefore the length that the tick was attached and any symptoms you have experienced after the bite are import to accurately develop your custom treatment plan. In most cases a single dose of doxycycline may prevent the development of a more serious condition.  Based on your information I have Provided a home care guide for tick bites  and  instructions on when to call for help.  Which ticks  are associated with illness?  The Wood Tick (dog tick) is the size of a watermelon seed and can sometimes transmit [facility] spotted fever and [place] tick fever.   The Deer Tick (black-legged tick) is between the size of a poppy seed (pin head) and an apple seed, and can sometimes transmit Lyme disease.  A brown to black tick with a white splotch on its back is likely a female Amblyomma americanum ([place] tick). This tick has been associated with Southern Tick Associated illness ( STARI)  Lyme disease has become the most common tick-borne illness in the [place]. The risk of Lyme disease following a recognized deer tick bite is estimated to be 1%.  The majority of cases of Lyme disease start with a bull's eye rash at the site of the tick bite. The rash can occur days to weeks (typically 7-10 days) after a tick bite. Treatment with antibiotics is indicated if this rash appears. Flu-like symptoms may accompany the rash, including: fever, chills, headaches, muscle aches, and fatigue. Removing ticks promptly may  prevent tick borne disease.  What can be used to prevent Tick Bites?  Insect repellant with at leas 20% DEET. Wearing long pants with sock and shoes. Avoiding tall grass and heavily wooded areas. Checking your skin after being outdoors. Shower with a washcloth after outdoor exposures.  HOME CARE ADVICE FOR TICK BITE  Wood Tick Removal:  Use a pair of tweezers and grasp the wood tick close to the skin (on its head). Pull the wood tick straight upward without twisting or crushing it. Maintain a steady pressure until it releases its grip.  If tweezers aren't available, use fingers, a loop of thread around the jaws, or a needle between the jaws for traction.  Note: covering the tick with petroleum jelly, nail polish or rubbing alcohol doesn't work. Neither does touching the tick with a hot or cold object. Tiny Deer Tick Removal:  Needs to be scraped off with a knife blade or credit card edge. Place tick in a sealed container (e.g. glass jar, zip lock plastic bag), in case your doctor wants to see it. Tick's Head Removal:  If the wood tick's head breaks off in the skin, it must be removed. Clean the skin. Then use a sterile needle to uncover the head and lift it out or scrape it off.  If a very small piece of the head remains, the skin will eventually slough it off. Antibiotic Ointment:  Wash the wound and your hands with soap and water after removal to  prevent catching any tick disease. Apply an over the counter antibiotic ointment (e.g. bacitracin) to the bite once. Expected Course: Tick bites normally don't itch or hurt. That's why they often go unnoticed. Call Your Doctor If:  You can't remove the tick or the tick's head Fever, a severe head ache, or rash occur in the next 2 weeks Bite begins to look infected Lyme's disease is common in your area You have not had a tetanus in the last 10 years Your current symptoms become worse    MAKE SURE YOU  Understand these  instructions. Will watch your condition. Will get help right away if you are not doing well or get worse.   Thank you for choosing an e-visit.  Your e-visit answers were reviewed by a board certified advanced clinical practitioner to complete your personal care plan. Depending upon the condition, your plan could have included both over the counter or prescription medications. Please review your pharmacy choice. If there is a problem you may use MyChart messaging to have the prescription routed to another pharmacy. Your safety is important to [place]. If you have drug allergies check your prescription carefully.   You can use MyChart to ask questions about today's visit, request a non-urgent call back, or ask for a work or school excuse for 24 hours related to this e-Visit. If it has been greater than 24 hours you will need to follow up with your provider, or enter a new e-Visit to address those concerns.  You will get an email in the next two days asking about your experience. I hope  that your e-visit has been valuable and will speed your recovery  ===View-only below this line===   ----- Message -----    From: Andrea Morrow    Sent: 01/20/2019  9:26 AM EDT      To: E-Visit Mailing List Subject: E-Visit Submission: Tick Bite  E-Visit Submission: Tick Bite --------------------------------  Question: Is the tick still attached? Answer:   No  Question: When were you bitten? Answer:   01/18/2019  Question: Where is the tick bite located? Answer:   On my lower back and on my shoulder  Question: Was the tick attached for greater than 48 hours? Answer:   No  Question: A tick is a small brown bug that bites into the skin. Which of the following best describes the tick that bit you? Answer:   A deer tick that is between the size of a poppy seed and an apple seed  Question: Are you experiencing any of the following symptoms? [place] all that apply) Answer:   A red ring or bulls eye rash that  occurs around the bite  Question: Does the site of the bite look infected (spreading redness or puss that started a day or two after the bite)? Answer:   Yes  Question: Have you had a tetanus booster in the last 10 years? Answer:   Yes  Question: Are you allergic to or intolerant of Doxycycline? Answer:   No  Question: Could you attach a picture of the bite? Answer:   Yes  Question: Have you ever had a tick borne illness in the past? Answer:   No  Question: Are there any other concerns related to the tick bite that you would like to share with your provider? Answer:   Just not sure if I need to be seen by Dr Carlota Raspberry.  I had 2 ticks on me Sunday.  I woke up during the night  Saturday and thought I felt something.  Tried to check it but didn't see anything.  In the morning, I felt it on my lower back and with my fingernail, scratched it off.  Not sure if it all came out.  Later in the morning, felt a bump on my shoulder and when I scratched again, another tick came off.  But I don't think that one bit me.  But my lower back does have redness and bump.  Question: Please list your medication allergies that you may have ? (If 'none' , please list as 'none') Answer:   None that I am aware of.  Question: Please list any additional comments  Answer:   Just concerned that the one on my lower back is infected.  I'm thinking the dog or cat brought the ticks in on their fur.   I went for the mail and that was the only time I went out of the house.  A total of 5-10 minutes was spent evaluating this patients questionnaire and formulating a plan of care.

## 2019-02-27 ENCOUNTER — Encounter: Payer: Self-pay | Admitting: Family Medicine

## 2019-02-27 ENCOUNTER — Ambulatory Visit (INDEPENDENT_AMBULATORY_CARE_PROVIDER_SITE_OTHER): Payer: Medicare HMO | Admitting: Family Medicine

## 2019-02-27 ENCOUNTER — Other Ambulatory Visit: Payer: Self-pay

## 2019-02-27 VITALS — BP 128/80 | HR 68 | Temp 98.1°F | Resp 12 | Ht 61.0 in | Wt 170.0 lb

## 2019-02-27 DIAGNOSIS — Z0001 Encounter for general adult medical examination with abnormal findings: Secondary | ICD-10-CM

## 2019-02-27 DIAGNOSIS — Z23 Encounter for immunization: Secondary | ICD-10-CM | POA: Diagnosis not present

## 2019-02-27 DIAGNOSIS — E785 Hyperlipidemia, unspecified: Secondary | ICD-10-CM

## 2019-02-27 DIAGNOSIS — Z0000 Encounter for general adult medical examination without abnormal findings: Secondary | ICD-10-CM

## 2019-02-27 DIAGNOSIS — I10 Essential (primary) hypertension: Secondary | ICD-10-CM

## 2019-02-27 LAB — COMPREHENSIVE METABOLIC PANEL
ALT: 18 IU/L (ref 0–32)
AST: 26 IU/L (ref 0–40)
Albumin/Globulin Ratio: 2.1 (ref 1.2–2.2)
Albumin: 4.6 g/dL (ref 3.8–4.8)
Alkaline Phosphatase: 61 IU/L (ref 39–117)
BUN/Creatinine Ratio: 15 (ref 12–28)
BUN: 13 mg/dL (ref 8–27)
Bilirubin Total: 0.4 mg/dL (ref 0.0–1.2)
CO2: 22 mmol/L (ref 20–29)
Calcium: 10.1 mg/dL (ref 8.7–10.3)
Chloride: 102 mmol/L (ref 96–106)
Creatinine, Ser: 0.87 mg/dL (ref 0.57–1.00)
GFR calc Af Amer: 80 mL/min/{1.73_m2} (ref >59)
GFR calc non Af Amer: 70 mL/min/{1.73_m2} (ref >59)
Globulin, Total: 2.2 g/dL (ref 1.5–4.5)
Glucose: 98 mg/dL (ref 65–99)
Potassium: 4 mmol/L (ref 3.5–5.2)
Sodium: 140 mmol/L (ref 134–144)
Total Protein: 6.8 g/dL (ref 6.0–8.5)

## 2019-02-27 LAB — LIPID PANEL
Chol/HDL Ratio: 2.3 ratio (ref 0.0–4.4)
Cholesterol, Total: 181 mg/dL (ref 100–199)
HDL: 78 mg/dL (ref >39)
LDL Calculated: 82 mg/dL (ref 0–99)
Triglycerides: 107 mg/dL (ref 0–149)
VLDL Cholesterol Cal: 21 mg/dL (ref 5–40)

## 2019-02-27 MED ORDER — ROSUVASTATIN CALCIUM 10 MG PO TABS: 10.0000 mg | ORAL_TABLET | Freq: Every day | ORAL | 3 refills | Status: DC

## 2019-02-27 MED ORDER — HYDROCHLOROTHIAZIDE 25 MG PO TABS: 25.0000 mg | ORAL_TABLET | Freq: Every day | ORAL | 3 refills | Status: DC

## 2019-02-27 NOTE — Progress Notes (Signed)
Subjective:    Patient ID: Andrea Morrow, female    DOB: 1950-12-03, 68 y.o.   MRN: [id number]  HPI  Andrea Morrow is a 68 y.o. female Presents today for: Chief Complaint  Patient presents with  . chronic medical condition    Patient last seen here on 02/25/2018 for welcome to medicare. Today supposed to be annual physical looking back at last visit. I worked her up for annual if Im wrong please let me know. Patient has been monitoring her bp and they have been running 112/80-136/90  routine follow up/annual wellness exam.   Care team:  Cardiology - [facility], Dentist- Trudie Buckler Gastorenterology - Ardis Hughs  Hypertension: BP Readings from Last 3 Encounters:  02/27/19 128/80  10/20/18 (!) 159/88  09/08/18 131/84   Lab Results  Component Value Date   CREATININE 0.84 09/08/2018  HCTZ 17m qd.  No new side effects. Home readings ok. Only rare lightheadedness if standing up quickly.  Home readings: 112-136/76-90.   Hyperlipidemia: crestor 170mqd. No new myalgias.  Lab Results  Component Value Date   CHOL 192 09/08/2018   HDL 76 09/08/2018   LDLCALC 93 09/08/2018   TRIG 114 09/08/2018   CHOLHDL 2.5 09/08/2018   Lab Results  Component Value Date   ALT 15 09/08/2018   AST 23 09/08/2018   ALKPHOS 66 09/08/2018   BILITOT 0.4 09/08/2018   Cancer screening: Colonoscopy 10/07/2017 Mammogram 08/20/2018  DEXA scan 04/14/2018  Immunization History  Administered Date(s) Administered  . Influenza,inj,Quad PF,6+ Mos 08/27/2017  . Pneumococcal Conjugate-13 02/25/2018  . Tdap 09/27/2014  . Zoster Recombinat (Shingrix) 04/08/2018, 07/02/2018  Due for Pneumovax  Fall screening: no falls in the past year, no safety concerns at home, no loose rugs. No concerns.   [facility] 02/27/2019 09/08/2018 02/25/2018 08/27/2017 10/01/2016  Decreased Interest 0 0 0 0 0  Down, Depressed, Hopeless 0 0 0 0 0  PHQ - 2 Score 0 0 0 0 0   Functional Status Survey:  Is the patient deaf or have difficulty hearing?: Yes(possible have been turning tv up more) Does the patient have difficulty seeing, even when wearing glasses/contacts?: (S) Yes(floaters) Does the patient have difficulty concentrating, remembering, or making decisions?: No Does the patient have difficulty walking or climbing stairs?: No Does the patient have difficulty dressing or bathing?: No Does the patient have difficulty doing errands alone such as visiting a doctor's office or shopping?: No  6CIT Screen 02/27/2019 02/25/2018  What Year? 0 points 0 points  What month? 0 points 0 points  What time? 0 points 0 points  Count back from 20 0 points 0 points  Months in reverse 0 points 0 points  Repeat phrase 0 points 0 points  Total Score 0 0     Visual Acuity Screening   Right eye Left eye Both eyes  Without correction:     With correction: 20/25 20/25 20/25   has floaters- optho every 2 years, retina specialist eval prior.   Dental: routine - every 6 months.   Exercise: walking, has been volunteering with [employer] miles per day, 40 min.   Alcohol/tobacco: no use of either.   Advanced directives: Does not have, information provided.   Patient Active Problem List   Diagnosis Date Noted  . Hyperlipidemia 03/30/2016  . Essential hypertension 03/30/2016  . Other screening mammogram 01/28/2014   Past Medical History:  Diagnosis Date  . Hyperlipidemia   . Hypertension  Past Surgical History:  Procedure Laterality Date  . TUBAL LIGATION     No Known Allergies Prior to Admission medications   Medication Sig Start Date End Date Taking? Authorizing Provider  hydrochlorothiazide (HYDRODIURIL) 25 MG tablet Take 1 tablet (25 mg total) by mouth daily. 09/08/18  Yes [name], MD  Multiple Vitamins-Calcium (ONE-A-DAY WOMENS PO) Take by mouth.   Yes [provider]  rosuvastatin (CRESTOR) 10 MG tablet Take 1 tablet (10 mg total) by mouth daily. 09/08/18   Yes [name], MD   Social History   Socioeconomic History  . Marital status: Married    Spouse name: Not on file  . Number of children: Not on file  . Years of education: Not on file  . Highest education level: Not on file  Occupational History  . Not on file  Social Needs  . Financial resource strain: Not on file  . Food insecurity:    Worry: Not on file    Inability: Not on file  . Transportation needs:    Medical: Not on file    Non-medical: Not on file  Tobacco Use  . Smoking status: Never Smoker  . Smokeless tobacco: Never Used  Substance and Sexual Activity  . Alcohol use: No    Frequency: Never  . Drug use: No  . Sexual activity: Not on file  Lifestyle  . Physical activity:    Days per week: Not on file    Minutes per session: Not on file  . Stress: Not on file  Relationships  . Social connections:    Talks on phone: Not on file    Gets together: Not on file    Attends religious service: Not on file    Active member of club or organization: Not on file    Attends meetings of clubs or organizations: Not on file    Relationship status: Not on file  . Intimate partner violence:    Fear of current or ex partner: Not on file    Emotionally abused: Not on file    Physically abused: Not on file    Forced sexual activity: Not on file  Other Topics Concern  . Not on file  Social History Narrative   Married   Clinical research associate    Review of Systems 13 point review of systems per patient health survey noted.  Negative other than as indicated above or in HPI.      Objective:   Physical Exam Constitutional:      Appearance: She is well-developed.  HENT:     Head: Normocephalic and atraumatic.     Right Ear: External ear normal.     Left Ear: External ear normal.  Eyes:     Conjunctiva/sclera: Conjunctivae normal.     Pupils: Pupils are equal, round, and reactive to light.  Neck:     Musculoskeletal: Normal range of motion and neck supple.      Thyroid: No thyromegaly.  Cardiovascular:     Rate and Rhythm: Normal rate and regular rhythm.     Heart sounds: Normal heart sounds. No murmur.  Pulmonary:     Effort: Pulmonary effort is normal. No respiratory distress.     Breath sounds: Normal breath sounds. No wheezing.  Abdominal:     General: Bowel sounds are normal.     Palpations: Abdomen is soft.     Tenderness: There is no abdominal tenderness.  Musculoskeletal: Normal range of motion.  General: No tenderness.  Lymphadenopathy:     Cervical: No cervical adenopathy.  Skin:    General: Skin is warm and dry.     Findings: No rash.  Neurological:     Mental Status: She is alert and oriented to person, place, and time.  Psychiatric:        Behavior: Behavior normal.        Thought Content: Thought content normal.    Vitals:   02/27/19 0824  BP: 128/80  Pulse: 68  Resp: 12  Temp: 98.1 F (36.7 C)  TempSrc: Oral  SpO2: 97%  Weight: 170 lb (77.1 kg)  Height: 5' 1"  (1.549 m)      Assessment & Plan:   Andrea Morrow is a 68 y.o. female Medicare annual wellness visit, subsequent  - - anticipatory guidance as below in AVS, screening labs if needed. Health maintenance items as above in HPI discussed/recommended as applicable.  - no concerning responses on depression, fall, or functional status screening. Any positive responses noted as above. Advanced directives discussed as in CH[facility].   Essential hypertension - Plan: Comprehensive metabolic panel, hydrochlorothiazide (HYDRODIURIL) 25 MG tablet  -Stable by home readings and actually on the lower side at times.  May be contributing to episodic lightheadedness with standing up.  Option of half dosing of hydrochlorothiazide with close monitoring of home readings.  Recheck 6 months  Hyperlipidemia, unspecified hyperlipidemia type - Plan: Comprehensive metabolic panel, Lipid panel, rosuvastatin (CRESTOR) 10 MG tablet  -  Stable, tolerating current regimen. Medications  refilled. Labs pending as above.   Need for prophylactic vaccination against Streptococcus pneumoniae (pneumococcus) - Plan: Pneumococcal polysaccharide vaccine 23-valent greater than or equal to 2yo subcutaneous/IM  - pneumovax given.   Meds ordered this encounter  Medications  . rosuvastatin (CRESTOR) 10 MG tablet    Sig: Take 1 tablet (10 mg total) by mouth daily.    Dispense:  90 tablet    Refill:  3  . hydrochlorothiazide (HYDRODIURIL) 25 MG tablet    Sig: Take 1 tablet (25 mg total) by mouth daily.    Dispense:  90 tablet    Refill:  3   Patient Instructions    Option if 1/2 dose of hydrochlorothiazide to see if less lightheadedness with standing. Watch readings and make sure remains under 140/90. If no change in symptoms, or readings elevate - return to full dose.  Return to the clinic or go to the nearest emergency room if any of your symptoms worsen or new symptoms occur.  No change in crestor dose for now.   Follow up in 6 months to discuss blood pressure. Let me know if there are questions sooner.    Preventive Care 49 Years and Older, Female Preventive care refers to lifestyle choices and visits with your health care provider that can promote health and wellness. What does preventive care include?  A yearly physical exam. This is also called an annual well check.  Dental exams once or twice a year.  Routine eye exams. Ask your health care provider how often you should have your eyes checked.  Personal lifestyle choices, including: ? Daily care of your teeth and gums. ? Regular physical activity. ? Eating a healthy diet. ? Avoiding tobacco and drug use. ? Limiting alcohol use. ? Practicing safe sex. ? Taking low-dose aspirin every day. ? Taking vitamin and mineral supplements as recommended by your health care provider. What happens during an annual well check? The services and screenings done by  your health care provider during your annual well check will  depend on your age, overall health, lifestyle risk factors, and family history of disease. Counseling Your health care provider may ask you questions about your:  Alcohol use.  Tobacco use.  Drug use.  Emotional well-being.  Home and relationship well-being.  Sexual activity.  Eating habits.  History of falls.  Memory and ability to understand (cognition).  Work and work Statistician.  Reproductive health.  Screening You may have the following tests or measurements:  Height, weight, and BMI.  Blood pressure.  Lipid and cholesterol levels. These may be checked every 5 years, or more frequently if you are over 72 years old.  Skin check.  Lung cancer screening. You may have this screening every year starting at age 37 if you have a 30-pack-year history of smoking and currently smoke or have quit within the past 15 years.  Colorectal cancer screening. All adults should have this screening starting at age 41 and continuing until age 50. You will have tests every 1-10 years, depending on your results and the type of screening test. People at increased risk should start screening at an earlier age. Screening tests may include: ? Guaiac-based fecal occult blood testing. ? Fecal immunochemical test (FIT). ? Stool DNA test. ? Virtual colonoscopy. ? Sigmoidoscopy. During this test, a flexible tube with a tiny camera (sigmoidoscope) is used to examine your rectum and lower colon. The sigmoidoscope is inserted through your anus into your rectum and lower colon. ? Colonoscopy. During this test, a long, thin, flexible tube with a tiny camera (colonoscope) is used to examine your entire colon and rectum.  Hepatitis C blood test.  Hepatitis B blood test.  Sexually transmitted disease (STD) testing.  Diabetes screening. This is done by checking your blood sugar (glucose) after you have not eaten for a while (fasting). You may have this done every 1-3 years.  Bone density scan.  This is done to screen for osteoporosis. You may have this done starting at age 62.  Mammogram. This may be done every 1-2 years. Talk to your health care provider about how often you should have regular mammograms. Talk with your health care provider about your test results, treatment options, and if necessary, the need for more tests. Vaccines Your health care provider may recommend certain vaccines, such as:  Influenza vaccine. This is recommended every year.  Tetanus, diphtheria, and acellular pertussis (Tdap, Td) vaccine. You may need a Td booster every 10 years.  Varicella vaccine. You may need this if you have not been vaccinated.  Zoster vaccine. You may need this after age 30.  Measles, mumps, and rubella (MMR) vaccine. You may need at least one dose of MMR if you were born in 1957 or later. You may also need a second dose.  Pneumococcal 13-valent conjugate (PCV13) vaccine. One dose is recommended after age 5.  Pneumococcal polysaccharide (PPSV23) vaccine. One dose is recommended after age 35.  Meningococcal vaccine. You may need this if you have certain conditions.  Hepatitis A vaccine. You may need this if you have certain conditions or if you travel or work in places where you may be exposed to hepatitis A.  Hepatitis B vaccine. You may need this if you have certain conditions or if you travel or work in places where you may be exposed to hepatitis B.  Haemophilus influenzae type b (Hib) vaccine. You may need this if you have certain conditions. Talk to your health care provider  about which screenings and vaccines you need and how often you need them. This information is not intended to replace advice given to you by your health care provider. Make sure you discuss any questions you have with your health care provider. Document Released: 10/07/2015 Document Revised: 10/31/2017 Document Reviewed: 07/12/2015 Elsevier Interactive Patient Education  [employer].     If you have lab work done today you will be contacted with your lab results within the next 2 weeks.  If you have not heard from [place] then please contact us. The fastest way to get your results is to register for My Chart.   IF you received an x-ray today, you will receive an invoice from [facility]. Please contact [facility] at [phone] with questions or concerns regarding your invoice.   IF you received labwork today, you will receive an invoice from [facility]. Please contact [facility] at [phone] with questions or concerns regarding your invoice.   Our billing staff will not be able to assist you with questions regarding bills from these companies.  You will be contacted with the lab results as soon as they are available. The fastest way to get your results is to activate your My Chart account. Instructions are located on the last page of this paperwork. If you have not heard from [place] regarding the results in 2 weeks, please contact this office.      Signed,   [name], MD [facility].  02/27/19 9:54 AM

## 2019-02-27 NOTE — Patient Instructions (Addendum)
Option if 1/2 dose of hydrochlorothiazide to see if less lightheadedness with standing. Watch readings and make sure remains under 140/90. If no change in symptoms, or readings elevate - return to full dose.  Return to the clinic or go to the nearest emergency room if any of your symptoms worsen or new symptoms occur.  No change in crestor dose for now.   Follow up in 6 months to discuss blood pressure. Let me know if there are questions sooner.    Preventive Care 67 Years and Older, Female Preventive care refers to lifestyle choices and visits with your health care provider that can promote health and wellness. What does preventive care include?  A yearly physical exam. This is also called an annual well check.  Dental exams once or twice a year.  Routine eye exams. Ask your health care provider how often you should have your eyes checked.  Personal lifestyle choices, including: ? Daily care of your teeth and gums. ? Regular physical activity. ? Eating a healthy diet. ? Avoiding tobacco and drug use. ? Limiting alcohol use. ? Practicing safe sex. ? Taking low-dose aspirin every day. ? Taking vitamin and mineral supplements as recommended by your health care provider. What happens during an annual well check? The services and screenings done by your health care provider during your annual well check will depend on your age, overall health, lifestyle risk factors, and family history of disease. Counseling Your health care provider may ask you questions about your:  Alcohol use.  Tobacco use.  Drug use.  Emotional well-being.  Home and relationship well-being.  Sexual activity.  Eating habits.  History of falls.  Memory and ability to understand (cognition).  Work and work Statistician.  Reproductive health.  Screening You may have the following tests or measurements:  Height, weight, and BMI.  Blood pressure.  Lipid and cholesterol levels. These may be  checked every 5 years, or more frequently if you are over 56 years old.  Skin check.  Lung cancer screening. You may have this screening every year starting at age 41 if you have a 30-pack-year history of smoking and currently smoke or have quit within the past 15 years.  Colorectal cancer screening. All adults should have this screening starting at age 15 and continuing until age 85. You will have tests every 1-10 years, depending on your results and the type of screening test. People at increased risk should start screening at an earlier age. Screening tests may include: ? Guaiac-based fecal occult blood testing. ? Fecal immunochemical test (FIT). ? Stool DNA test. ? Virtual colonoscopy. ? Sigmoidoscopy. During this test, a flexible tube with a tiny camera (sigmoidoscope) is used to examine your rectum and lower colon. The sigmoidoscope is inserted through your anus into your rectum and lower colon. ? Colonoscopy. During this test, a long, thin, flexible tube with a tiny camera (colonoscope) is used to examine your entire colon and rectum.  Hepatitis C blood test.  Hepatitis B blood test.  Sexually transmitted disease (STD) testing.  Diabetes screening. This is done by checking your blood sugar (glucose) after you have not eaten for a while (fasting). You may have this done every 1-3 years.  Bone density scan. This is done to screen for osteoporosis. You may have this done starting at age 74.  Mammogram. This may be done every 1-2 years. Talk to your health care provider about how often you should have regular mammograms. Talk with your health care provider  about your test results, treatment options, and if necessary, the need for more tests. Vaccines Your health care provider may recommend certain vaccines, such as:  Influenza vaccine. This is recommended every year.  Tetanus, diphtheria, and acellular pertussis (Tdap, Td) vaccine. You may need a Td booster every 10  years.  Varicella vaccine. You may need this if you have not been vaccinated.  Zoster vaccine. You may need this after age 38.  Measles, mumps, and rubella (MMR) vaccine. You may need at least one dose of MMR if you were born in 1957 or later. You may also need a second dose.  Pneumococcal 13-valent conjugate (PCV13) vaccine. One dose is recommended after age 74.  Pneumococcal polysaccharide (PPSV23) vaccine. One dose is recommended after age 51.  Meningococcal vaccine. You may need this if you have certain conditions.  Hepatitis A vaccine. You may need this if you have certain conditions or if you travel or work in places where you may be exposed to hepatitis A.  Hepatitis B vaccine. You may need this if you have certain conditions or if you travel or work in places where you may be exposed to hepatitis B.  Haemophilus influenzae type b (Hib) vaccine. You may need this if you have certain conditions. Talk to your health care provider about which screenings and vaccines you need and how often you need them. This information is not intended to replace advice given to you by your health care provider. Make sure you discuss any questions you have with your health care provider. Document Released: 10/07/2015 Document Revised: 10/31/2017 Document Reviewed: 07/12/2015 Elsevier Interactive Patient Education  [employer].    If you have lab work done today you will be contacted with your lab results within the next 2 weeks.  If you have not heard from [place] then please contact us. The fastest way to get your results is to register for My Chart.   IF you received an x-ray today, you will receive an invoice from [facility]. Please contact [facility] at [phone] with questions or concerns regarding your invoice.   IF you received labwork today, you will receive an invoice from [facility] [facility]. Please contact [facility] at [phone] with questions or concerns regarding your  invoice.   Our billing staff will not be able to assist you with questions regarding bills from these companies.  You will be contacted with the lab results as soon as they are available. The fastest way to get your results is to activate your My Chart account. Instructions are located on the last page of this paperwork. If you have not heard from [place] regarding the results in 2 weeks, please contact this office.

## 2019-03-03 ENCOUNTER — Ambulatory Visit: Payer: Self-pay

## 2019-03-05 ENCOUNTER — Ambulatory Visit: Payer: Medicare HMO | Admitting: Family Medicine

## 2019-03-30 ENCOUNTER — Encounter: Payer: Self-pay | Admitting: Family Medicine

## 2019-07-06 ENCOUNTER — Ambulatory Visit: Payer: Medicare HMO

## 2019-08-28 ENCOUNTER — Ambulatory Visit: Payer: Medicare HMO | Admitting: Family Medicine

## 2019-09-09 DIAGNOSIS — Z1231 Encounter for screening mammogram for malignant neoplasm of breast: Secondary | ICD-10-CM | POA: Diagnosis not present

## 2019-09-09 LAB — HM MAMMOGRAPHY

## 2019-09-10 ENCOUNTER — Encounter: Payer: Self-pay | Admitting: Family Medicine

## 2019-09-10 ENCOUNTER — Other Ambulatory Visit: Payer: Self-pay

## 2019-09-10 ENCOUNTER — Ambulatory Visit (INDEPENDENT_AMBULATORY_CARE_PROVIDER_SITE_OTHER): Payer: Medicare HMO | Admitting: Family Medicine

## 2019-09-10 VITALS — BP 128/78 | HR 73 | Temp 98.2°F | Wt 173.2 lb

## 2019-09-10 DIAGNOSIS — Z23 Encounter for immunization: Secondary | ICD-10-CM | POA: Diagnosis not present

## 2019-09-10 DIAGNOSIS — I10 Essential (primary) hypertension: Secondary | ICD-10-CM | POA: Diagnosis not present

## 2019-09-10 DIAGNOSIS — E785 Hyperlipidemia, unspecified: Secondary | ICD-10-CM | POA: Diagnosis not present

## 2019-09-10 LAB — LIPID PANEL
Chol/HDL Ratio: 2.3 ratio (ref 0.0–4.4)
Cholesterol, Total: 166 mg/dL (ref 100–199)
HDL: 71 mg/dL (ref >39)
LDL Chol Calc (NIH): 77 mg/dL (ref 0–99)
Triglycerides: 101 mg/dL (ref 0–149)
VLDL Cholesterol Cal: 18 mg/dL (ref 5–40)

## 2019-09-10 LAB — COMPREHENSIVE METABOLIC PANEL
ALT: 15 IU/L (ref 0–32)
AST: 24 IU/L (ref 0–40)
Albumin/Globulin Ratio: 1.7 (ref 1.2–2.2)
Albumin: 4.1 g/dL (ref 3.8–4.8)
Alkaline Phosphatase: 69 IU/L (ref 39–117)
BUN/Creatinine Ratio: 20 (ref 12–28)
BUN: 15 mg/dL (ref 8–27)
Bilirubin Total: 0.4 mg/dL (ref 0.0–1.2)
CO2: 21 mmol/L (ref 20–29)
Calcium: 9.9 mg/dL (ref 8.7–10.3)
Chloride: 104 mmol/L (ref 96–106)
Creatinine, Ser: 0.76 mg/dL (ref 0.57–1.00)
GFR calc Af Amer: 95 mL/min/{1.73_m2} (ref >59)
GFR calc non Af Amer: 82 mL/min/{1.73_m2} (ref >59)
Globulin, Total: 2.4 g/dL (ref 1.5–4.5)
Glucose: 100 mg/dL — ABNORMAL HIGH (ref 65–99)
Potassium: 4.4 mmol/L (ref 3.5–5.2)
Sodium: 139 mmol/L (ref 134–144)
Total Protein: 6.5 g/dL (ref 6.0–8.5)

## 2019-09-10 NOTE — Progress Notes (Signed)
Subjective:  Patient ID: Andrea Morrow, female    DOB: Sep 20, 1951  Age: 68 y.o. MRN: [id number]  CC:  Chief Complaint  Patient presents with  . Medical Management of Chronic Issues    6 month f/u on blood pressure. Patient have no other issues to discuss at this time    HPI Andrea Morrow presents for   Hypertension: Hydrochlorothiazide 25 mg daily - taking 1/2 pill per day.  Home readings: 117-120's/80.  Occasional small hard stool/pellets. Not frequent.  BP Readings from Last 3 Encounters:  09/10/19 128/78  02/27/19 128/80  10/20/18 (!) 159/88   Lab Results  Component Value Date   CREATININE 0.87 02/27/2019   Hyperlipidemia: Crestor 10 mg daily.  No new myalgias/side effects.  Lab Results  Component Value Date   CHOL 181 02/27/2019   HDL 78 02/27/2019   LDLCALC 82 02/27/2019   TRIG 107 02/27/2019   CHOLHDL 2.3 02/27/2019   Lab Results  Component Value Date   ALT 18 02/27/2019   AST 26 02/27/2019   ALKPHOS 61 02/27/2019   BILITOT 0.4 02/27/2019   HM:  mmg yesterday.  Flu vaccine today.   History Patient Active Problem List   Diagnosis Date Noted  . Hyperlipidemia 03/30/2016  . Essential hypertension 03/30/2016  . Other screening mammogram 01/28/2014   Past Medical History:  Diagnosis Date  . Hyperlipidemia   . Hypertension    Past Surgical History:  Procedure Laterality Date  . TUBAL LIGATION     No Known Allergies Prior to Admission medications   Medication Sig Start Date End Date Taking? Authorizing Provider  hydrochlorothiazide (HYDRODIURIL) 25 MG tablet Take 1 tablet (25 mg total) by mouth daily. 02/27/19  Yes [name], MD  Multiple Vitamins-Calcium (ONE-A-DAY WOMENS PO) Take by mouth.   Yes [provider]  rosuvastatin (CRESTOR) 10 MG tablet Take 1 tablet (10 mg total) by mouth daily. 02/27/19  Yes [name], MD   Social History   Socioeconomic History  . Marital status: Married    Spouse name: Not on file  .  Number of children: Not on file  . Years of education: Not on file  . Highest education level: Not on file  Occupational History  . Not on file  Tobacco Use  . Smoking status: Never Smoker  . Smokeless tobacco: Never Used  Substance and Sexual Activity  . Alcohol use: No  . Drug use: No  . Sexual activity: Not on file  Other Topics Concern  . Not on file  Social History Narrative   Married   Clinical research associate   Social Determinants of Health   Financial Resource Strain:   . Difficulty of Paying Living Expenses: Not on file  Food Insecurity:   . Worried About Charity fundraiser in the Last Year: Not on file  . Ran Out of Food in the Last Year: Not on file  Transportation Needs:   . Lack of Transportation (Medical): Not on file  . Lack of Transportation (Non-Medical): Not on file  Physical Activity:   . Days of Exercise per Week: Not on file  . Minutes of Exercise per Session: Not on file  Stress:   . Feeling of Stress : Not on file  Social Connections:   . Frequency of Communication with Friends and Family: Not on file  . Frequency of Social Gatherings with Friends and Family: Not on file  . Attends Religious Services: Not on file  .  Active Member of Clubs or Organizations: Not on file  . Attends Archivist Meetings: Not on file  . Marital Status: Not on file  Intimate Partner Violence:   . Fear of Current or Ex-Partner: Not on file  . Emotionally Abused: Not on file  . Physically Abused: Not on file  . Sexually Abused: Not on file    Review of Systems  Constitutional: Negative for fatigue and unexpected weight change.  Respiratory: Negative for chest tightness and shortness of breath.   Cardiovascular: Negative for chest pain, palpitations and leg swelling.  Gastrointestinal: Negative for abdominal pain and blood in stool.  Neurological: Negative for dizziness, syncope, light-headedness and headaches.     Objective:   Vitals:   09/10/19 0811  09/10/19 0835  BP: 140/90 128/78  Pulse: 73   Temp: 98.2 F (36.8 C)   TempSrc: Temporal   SpO2: 98%   Weight: 173 lb 3.2 oz (78.6 kg)      Physical Exam Vitals reviewed.  Constitutional:      Appearance: She is well-developed.  HENT:     Head: Normocephalic and atraumatic.  Eyes:     Conjunctiva/sclera: Conjunctivae normal.     Pupils: Pupils are equal, round, and reactive to light.  Neck:     Vascular: No carotid bruit.  Cardiovascular:     Rate and Rhythm: Normal rate and regular rhythm.     Heart sounds: Normal heart sounds.  Pulmonary:     Effort: Pulmonary effort is normal.     Breath sounds: Normal breath sounds.  Abdominal:     Palpations: Abdomen is soft. There is no pulsatile mass.     Tenderness: There is no abdominal tenderness.  Skin:    General: Skin is warm and dry.  Neurological:     Mental Status: She is alert and oriented to person, place, and time.  Psychiatric:        Mood and Affect: Mood normal.        Behavior: Behavior normal.      Assessment & Plan:  Andrea Morrow is a 68 y.o. female . Essential hypertension - Plan: Comprehensive metabolic panel  - stable at 1/2 dose HCTZ 25mg  (12.5mg )  - labs pending.   Need for prophylactic vaccination and inoculation against influenza - Plan: Flu Vaccine QUAD High Dose(Fluad)  Hyperlipidemia, unspecified hyperlipidemia type - Plan: Lipid Panel  - Stable, tolerating current regimen. Labs pending as above.  continue crestor same dose.   No orders of the defined types were placed in this encounter.  Patient Instructions     No changes in meds at this time. Fiber in diet, dink sufficient water.  If hard stools return, can discuss further.  Let me know if there are questions and take care.   If you have lab work done today you will be contacted with your lab results within the next 2 weeks.  If you have not heard from [place] then please contact us. The fastest way to get your results is to register for  My Chart.   IF you received an x-ray today, you will receive an invoice from [facility]. Please contact [facility] at [phone] with questions or concerns regarding your invoice.   IF you received labwork today, you will receive an invoice from [place] [facility]. Please contact [facility] at [phone] with questions or concerns regarding your invoice.   Our billing staff will not be able to assist you with questions regarding bills from these companies.  You will be contacted with the lab results as soon as they are available. The fastest way to get your results is to activate your My Chart account. Instructions are located on the last page of this paperwork. If you have not heard from [place] regarding the results in 2 weeks, please contact this office.          Signed, [name], MD [facility]

## 2019-09-10 NOTE — Patient Instructions (Addendum)
   No changes in meds at this time. Fiber in diet, dink sufficient water.  If hard stools return, can discuss further.  Let me know if there are questions and take care.   If you have lab work done today you will be contacted with your lab results within the next 2 weeks.  If you have not heard from [place] then please contact us. The fastest way to get your results is to register for My Chart.   IF you received an x-ray today, you will receive an invoice from [facility]. Please contact [facility] at [phone] with questions or concerns regarding your invoice.   IF you received labwork today, you will receive an invoice from [place]. Please contact [facility] at [phone] with questions or concerns regarding your invoice.   Our billing staff will not be able to assist you with questions regarding bills from these companies.  You will be contacted with the lab results as soon as they are available. The fastest way to get your results is to activate your My Chart account. Instructions are located on the last page of this paperwork. If you have not heard from [place] regarding the results in 2 weeks, please contact this office.

## 2019-09-16 ENCOUNTER — Encounter: Payer: Self-pay | Admitting: Family Medicine

## 2019-09-16 NOTE — Progress Notes (Signed)
No findings suspicious for malignancy

## 2019-10-27 DIAGNOSIS — Z0100 Encounter for examination of eyes and vision without abnormal findings: Secondary | ICD-10-CM | POA: Diagnosis not present

## 2019-10-27 DIAGNOSIS — Z0101 Encounter for examination of eyes and vision with abnormal findings: Secondary | ICD-10-CM | POA: Diagnosis not present

## 2019-12-21 DIAGNOSIS — L438 Other lichen planus: Secondary | ICD-10-CM | POA: Diagnosis not present

## 2019-12-23 DIAGNOSIS — L438 Other lichen planus: Secondary | ICD-10-CM | POA: Diagnosis not present

## 2019-12-31 ENCOUNTER — Telehealth (INDEPENDENT_AMBULATORY_CARE_PROVIDER_SITE_OTHER): Payer: Medicare HMO | Admitting: Family Medicine

## 2019-12-31 ENCOUNTER — Other Ambulatory Visit: Payer: Self-pay

## 2019-12-31 DIAGNOSIS — R6884 Jaw pain: Secondary | ICD-10-CM | POA: Diagnosis not present

## 2019-12-31 DIAGNOSIS — R519 Headache, unspecified: Secondary | ICD-10-CM | POA: Diagnosis not present

## 2019-12-31 NOTE — Progress Notes (Signed)
Pt had dental work beginning or march one week later started having pain in ear and jaw with headache had a biopsy done they found inflammation nothing else. Headache and earache on and off since last two days she's been okay

## 2019-12-31 NOTE — Progress Notes (Signed)
Virtual Visit via Video Note  I connected with Andrea Morrow on 12/31/19 at 5:55 PM by a video enabled telemedicine application and verified that I am speaking with the correct person using two identifiers.   I discussed the limitations, risks, security and privacy concerns of performing an evaluation and management service by telephone and the availability of in person appointments. I also discussed with the patient that there may be a patient responsible charge related to this service. The patient expressed understanding and agreed to proceed, consent obtained  Chief complaint:  Chief Complaint  Patient presents with  . Headache    pt has had headaches and ear ache for nearly a month on and off, has not had either the past 2 days, had dental work very begning of March, no common factors enviromental or stress      History of Present Illness: Andrea Morrow is a 68 y.o. female  Headaches, ear pain: dental work early march - March 8th - replaced filling- upper right.  On and off HA  - temple pain R greater than left - off and an and radiated to top of head. r greater than left ear pain. No change in hearing or discharge. Resolved in 5-15 min with tylenol. Felt this way for 3 weeks.  Has been feeling better past 2 days. Last felt min HA 3 days ago with slight fatigue.  No vision changes. No darkening. Wearing new glasses past few weeks. Ones for computer as well.  Had biopsy of gum area inside of mouth last Monday - told had inflammation.   Sore in upper jaw. Was sore at dental provider when they pushed on upper jaw on upper R side - last Monday.  Jaw soreness resolved.  Feels back to normal past few days.  No focal weakness, no slurred speech.    Patient Active Problem List   Diagnosis Date Noted  . Hyperlipidemia 03/30/2016  . Essential hypertension 03/30/2016  . Other screening mammogram 01/28/2014   Past Medical History:  Diagnosis Date  . Hyperlipidemia   . Hypertension     Past Surgical History:  Procedure Laterality Date  . TUBAL LIGATION     No Known Allergies Prior to Admission medications   Medication Sig Start Date End Date Taking? Authorizing Provider  hydrochlorothiazide (HYDRODIURIL) 25 MG tablet Take 1 tablet (25 mg total) by mouth daily. 02/27/19  Yes [name], MD  Multiple Vitamins-Calcium (ONE-A-DAY WOMENS PO) Take by mouth.   Yes [provider]  rosuvastatin (CRESTOR) 10 MG tablet Take 1 tablet (10 mg total) by mouth daily. 02/27/19  Yes [name], MD   Social History   Socioeconomic History  . Marital status: Married    Spouse name: Not on file  . Number of children: Not on file  . Years of education: Not on file  . Highest education level: Not on file  Occupational History  . Not on file  Tobacco Use  . Smoking status: Never Smoker  . Smokeless tobacco: Never Used  Substance and Sexual Activity  . Alcohol use: No  . Drug use: No  . Sexual activity: Not on file  Other Topics Concern  . Not on file  Social History Narrative   Married   Clinical research associate   Social Determinants of Health   Financial Resource Strain:   . Difficulty of Paying Living Expenses:   Food Insecurity:   . Worried About Charity fundraiser in the Last  Year:   . Ran Out of Food in the Last Year:   Transportation Needs:   . Film/video editor (Medical):   Marland Kitchen Lack of Transportation (Non-Medical):   Physical Activity:   . Days of Exercise per Week:   . Minutes of Exercise per Session:   Stress:   . Feeling of Stress :   Social Connections:   . Frequency of Communication with Friends and Family:   . Frequency of Social Gatherings with Friends and Family:   . Attends Religious Services:   . Active Member of Clubs or Organizations:   . Attends Archivist Meetings:   Marland Kitchen Marital Status:   Intimate Partner Violence:   . Fear of Current or Ex-Partner:   . Emotionally Abused:   Marland Kitchen Physically Abused:   . Sexually  Abused:     Observations/Objective: There were no vitals filed for this visit.  no distress, nontoxic appearance.  Speaking normally without distress.  Normal facial movement/equal facial movements.  Asymptomatic at present.  Assessment and Plan: Temporal headache - Plan: Sedimentation Rate  Jaw pain - Plan: Sedimentation Rate 3-week history of intermittent bilateral jaw pain, temporal headache right greater than left.  Radiation to the ear.  Potentially could have had TMJ syndrome/pain from previous dental work and jaw opening during that time.  Unlikely temporal arteritis/giant cell arteritis.  Denies any vision changes.  Now asymptomatic past few days.  Will check sed rate but again less likely temporal arteritis and currently asymptomatic.  RTC precautions given.  Follow Up Instructions:    I discussed the assessment and treatment plan with the patient. The patient was provided an opportunity to ask questions and all were answered. The patient agreed with the plan and demonstrated an understanding of the instructions.   The patient was advised to call back or seek an in-person evaluation if the symptoms worsen or if the condition fails to improve as anticipated.  I provided 19 minutes of non-face-to-face time during this encounter.   [name], MD

## 2019-12-31 NOTE — Patient Instructions (Addendum)
Good talking to you today.  Jaw pain/headache could have been due to temporomandibular joint soreness after the dental work.  It is unlikely that you had temporal arteritis/giant cell arteritis which is a type of headache at the temporal areas, but I think it is reasonable to check an inflammation test or sed rate.  I will have our staff call and schedule a lab visit for you tomorrow.  As symptoms have resolved, I do not think any other intervention is needed at this time or other testing.  If symptoms return, follow-up and we can discuss other possibilities. Take care!    If you have lab work done today you will be contacted with your lab results within the next 2 weeks.  If you have not heard from [place] then please contact us. The fastest way to get your results is to register for My Chart.   IF you received an x-ray today, you will receive an invoice from [facility]. Please contact [facility] at [phone] with questions or concerns regarding your invoice.   IF you received labwork today, you will receive an invoice from [place]. Please contact [facility] at [phone] with questions or concerns regarding your invoice.   Our billing staff will not be able to assist you with questions regarding bills from these companies.  You will be contacted with the lab results as soon as they are available. The fastest way to get your results is to activate your My Chart account. Instructions are located on the last page of this paperwork. If you have not heard from [place] regarding the results in 2 weeks, please contact this office.

## 2020-01-01 ENCOUNTER — Ambulatory Visit (INDEPENDENT_AMBULATORY_CARE_PROVIDER_SITE_OTHER): Payer: Medicare HMO | Admitting: Family Medicine

## 2020-01-01 DIAGNOSIS — R6884 Jaw pain: Secondary | ICD-10-CM | POA: Diagnosis not present

## 2020-01-01 DIAGNOSIS — R519 Headache, unspecified: Secondary | ICD-10-CM

## 2020-01-01 NOTE — Progress Notes (Signed)
Spoke with pt and scheduled nurse visit 

## 2020-01-02 LAB — SEDIMENTATION RATE: Sed Rate: 12 mm/hr (ref 0–40)

## 2020-02-28 ENCOUNTER — Other Ambulatory Visit: Payer: Self-pay | Admitting: Family Medicine

## 2020-02-28 DIAGNOSIS — I10 Essential (primary) hypertension: Secondary | ICD-10-CM

## 2020-02-28 NOTE — Telephone Encounter (Signed)
Requested Prescriptions  Pending Prescriptions Disp Refills  . hydrochlorothiazide (HYDRODIURIL) 25 MG tablet [Pharmacy Med Name: hydroCHLOROthiazide Oral Tablet 25 MG] 90 tablet 0    Sig: TAKE ONE TABLET BY MOUTH ONE TIME DAILY     Cardiovascular: Diuretics - Thiazide Passed - 02/28/2020  6:49 AM      Passed - Ca in normal range and within 360 days    Calcium  Date Value Ref Range Status  09/10/2019 9.9 8.7 - 10.3 mg/dL Final         Passed - Cr in normal range and within 360 days    Creat  Date Value Ref Range Status  03/30/2016 0.95 0.50 - 0.99 mg/dL Final    Comment:      For patients > or = 68 years of age: The upper reference limit for Creatinine is approximately 13% higher for people identified as African-American.      Creatinine, Ser  Date Value Ref Range Status  09/10/2019 0.76 0.57 - 1.00 mg/dL Final         Passed - K in normal range and within 360 days    Potassium  Date Value Ref Range Status  09/10/2019 4.4 3.5 - 5.2 mmol/L Final         Passed - Na in normal range and within 360 days    Sodium  Date Value Ref Range Status  09/10/2019 139 134 - 144 mmol/L Final         Passed - Last BP in normal range    BP Readings from Last 1 Encounters:  09/10/19 128/78         Passed - Valid encounter within last 6 months    Recent Outpatient Visits          1 month ago Temporal headache   [facility] [place], MD   1 month ago Temporal headache   [facility] [place], MD   5 months ago Essential hypertension   [facility] [place], Ranell Patrick, MD   1 year ago Medicare annual wellness visit, subsequent   [facility] [place], Ranell Patrick, MD   1 year ago Hyperlipidemia, unspecified hyperlipidemia type   [facility] [place], Ranell Patrick, MD      Future Appointments            In 2 weeks Carlota Raspberry Ranell Patrick, MD [facility] [place], [facility]

## 2020-03-10 ENCOUNTER — Encounter: Payer: Medicare HMO | Admitting: Family Medicine

## 2020-03-14 ENCOUNTER — Other Ambulatory Visit: Payer: Self-pay

## 2020-03-14 ENCOUNTER — Encounter: Payer: Self-pay | Admitting: Family Medicine

## 2020-03-14 ENCOUNTER — Ambulatory Visit (INDEPENDENT_AMBULATORY_CARE_PROVIDER_SITE_OTHER): Payer: Medicare HMO | Admitting: Family Medicine

## 2020-03-14 VITALS — BP 128/84 | HR 60 | Temp 98.0°F | Resp 14 | Ht 61.0 in | Wt 173.0 lb

## 2020-03-14 DIAGNOSIS — E785 Hyperlipidemia, unspecified: Secondary | ICD-10-CM

## 2020-03-14 DIAGNOSIS — N819 Female genital prolapse, unspecified: Secondary | ICD-10-CM

## 2020-03-14 DIAGNOSIS — I10 Essential (primary) hypertension: Secondary | ICD-10-CM

## 2020-03-14 MED ORDER — HYDROCHLOROTHIAZIDE 12.5 MG PO CAPS: 12.5000 mg | ORAL_CAPSULE | Freq: Every day | ORAL | 3 refills | Status: DC

## 2020-03-14 MED ORDER — ROSUVASTATIN CALCIUM 10 MG PO TABS: 10.0000 mg | ORAL_TABLET | Freq: Every day | ORAL | 3 refills | Status: DC

## 2020-03-14 NOTE — Progress Notes (Signed)
Subjective:  Patient ID: Andrea Morrow, female    DOB: [birth year]  Age: 69 y.o. MRN: [id number]  CC:  Chief Complaint  Patient presents with  . Hypertension    pt takes BP at home 116/73, 136/84, 125/87, 139/88 pt denies physical symptoms, pt denies side effects. notes some foot sweeling but this has just started yesterday and lasted through the evening.     HPI Andrea Morrow presents for   Hypertension: Takes hydrochlorothiazide 25 mg - 1/2 pill per day.  Home readings: 116-139/70-80's.  No new side effects.  Some ankle swelling yesterday only- drove from vacation yesterday. Better today.  BP Readings from Last 3 Encounters:  03/14/20 128/84  09/10/19 128/78  02/27/19 128/80   Lab Results  Component Value Date   CREATININE 0.76 09/10/2019   Hyperlipidemia:  Crestor 10 mg daily. No new myalgias/side effects.  Lab Results  Component Value Date   CHOL 166 09/10/2019   HDL 71 09/10/2019   LDLCALC 77 09/10/2019   TRIG 101 09/10/2019   CHOLHDL 2.3 09/10/2019   Lab Results  Component Value Date   ALT 15 09/10/2019   AST 24 09/10/2019   ALKPHOS 69 09/10/2019   BILITOT 0.4 09/10/2019    Prolapsed Uterus: Diagnosed by obgyn in past, but asx at that time. . Notices some pebbles with BM at times. Uses sitz bath to help express BM. Some possible straining. BM QD after sitz bath and splinting to express small pellets. . No incontinence. Gas at times. Sx's for yuears.  No diarrhea.  No recent obgyn eval.  GI: Andrea Morrow, colonoscopy Jan 2019, diverticulosis, polyp removed.    History Patient Active Problem List   Diagnosis Date Noted  . Hyperlipidemia 03/30/2016  . Essential hypertension 03/30/2016  . Other screening mammogram 01/28/2014   Past Medical History:  Diagnosis Date  . Hyperlipidemia   . Hypertension    Past Surgical History:  Procedure Laterality Date  . TUBAL LIGATION     No Known Allergies Prior to Admission medications   Medication Sig Start Date  End Date Taking? Authorizing Provider  hydrochlorothiazide (HYDRODIURIL) 25 MG tablet TAKE ONE TABLET BY MOUTH ONE TIME DAILY  02/28/20  Yes [name], MD  Multiple Vitamins-Calcium (ONE-A-DAY WOMENS PO) Take by mouth.   Yes [provider]  rosuvastatin (CRESTOR) 10 MG tablet Take 1 tablet (10 mg total) by mouth daily. 02/27/19  Yes [name], MD   Social History   Socioeconomic History  . Marital status: Married    Spouse name: Not on file  . Number of children: Not on file  . Years of education: Not on file  . Highest education level: Not on file  Occupational History  . Not on file  Tobacco Use  . Smoking status: Never Smoker  . Smokeless tobacco: Never Used  Vaping Use  . Vaping Use: Never used  Substance and Sexual Activity  . Alcohol use: No  . Drug use: No  . Sexual activity: Not on file  Other Topics Concern  . Not on file  Social History Narrative   Married   Clinical research associate   Social Determinants of Health   Financial Resource Strain:   . Difficulty of Paying Living Expenses:   Food Insecurity:   . Worried About Charity fundraiser in the Last Year:   . Arboriculturist in the Last Year:   Transportation Needs:   . Film/video editor (Medical):   Marland Kitchen  Lack of Transportation (Non-Medical):   Physical Activity:   . Days of Exercise per Week:   . Minutes of Exercise per Session:   Stress:   . Feeling of Stress :   Social Connections:   . Frequency of Communication with Friends and Family:   . Frequency of Social Gatherings with Friends and Family:   . Attends Religious Services:   . Active Member of Clubs or Organizations:   . Attends Archivist Meetings:   Marland Kitchen Marital Status:   Intimate Partner Violence:   . Fear of Current or Ex-Partner:   . Emotionally Abused:   Marland Kitchen Physically Abused:   . Sexually Abused:     Review of Systems  Constitutional: Negative for fatigue and unexpected weight change.  Respiratory: Negative  for chest tightness and shortness of breath.   Cardiovascular: Negative for chest pain, palpitations and leg swelling (min at ankles only yesterday. ).  Gastrointestinal: Negative for abdominal pain, blood in stool and rectal pain.  Genitourinary: Negative for difficulty urinating.  Neurological: Negative for dizziness, syncope, light-headedness and headaches.     Objective:   Vitals:   03/14/20 1348  BP: 128/84  Pulse: 60  Resp: 14  Temp: 98 F (36.7 C)  TempSrc: Temporal  SpO2: 98%  Weight: 173 lb (78.5 kg)  Height: 5\' 1"  (1.549 m)     Physical Exam Vitals reviewed.  Constitutional:      Appearance: She is well-developed.  HENT:     Head: Normocephalic and atraumatic.  Eyes:     Conjunctiva/sclera: Conjunctivae normal.     Pupils: Pupils are equal, round, and reactive to light.  Neck:     Vascular: No carotid bruit.  Cardiovascular:     Rate and Rhythm: Normal rate and regular rhythm.     Heart sounds: Normal heart sounds.  Pulmonary:     Effort: Pulmonary effort is normal.     Breath sounds: Normal breath sounds.  Abdominal:     Palpations: Abdomen is soft. There is no pulsatile mass.     Tenderness: There is no abdominal tenderness.  Musculoskeletal:     Right lower leg: Edema (trace pedal at ankles, nonpitting. ) present.     Left lower leg: Edema present.  Skin:    General: Skin is warm and dry.  Neurological:     Mental Status: She is alert and oriented to person, place, and time.  Psychiatric:        Behavior: Behavior normal.        Assessment & Plan:  Andrea Morrow is a 69 y.o. female . Essential hypertension - Plan: hydrochlorothiazide (MICROZIDE) 12.5 MG capsule  - stable on 12.5 mg dose. Continue same.   Hyperlipidemia, unspecified hyperlipidemia type - Plan: Comprehensive metabolic panel, Lipid panel, rosuvastatin (CRESTOR) 10 MG tablet   -  Stable, tolerating current regimen. Medications refilled. Labs pending as above.   Female  genital prolapse, unspecified type - Plan: Ambulatory referral to Gynecology  - pelvic organ prolapse, with splinting requirement with defecation. Suspected possible apical or posterior compartment defect. Will refer to Gynecology for evaluation and exam to determine appropriate next step, including possible pessary.. Stool softener if needed for hard stools.   Meds ordered this encounter  Medications  . hydrochlorothiazide (MICROZIDE) 12.5 MG capsule    Sig: Take 1 capsule (12.5 mg total) by mouth daily.    Dispense:  30 capsule    Refill:  3  . rosuvastatin (CRESTOR) 10 MG tablet  Sig: Take 1 tablet (10 mg total) by mouth daily.    Dispense:  90 tablet    Refill:  3   Patient Instructions    I will refer you to OB/GYN for repeat exam and discussion of pelvic organ prolapse, along with possible treatment options.  Stool softener over-the-counter can help if you are having hard stools.  Continue hydrochlorothiazide at 12.5 mg dose, I sent that new prescription to your pharmacy.  Continue Crestor same dose.  Labs today, follow-up within 6 months for wellness exam.   Let me know if there are any questions and take care   If you have lab work done today you will be contacted with your lab results within the next 2 weeks.  If you have not heard from [place] then please contact us. The fastest way to get your results is to register for My Chart.   IF you received an x-ray today, you will receive an invoice from [facility]. Please contact [facility]iology at [phone] with questions or concerns regarding your invoice.   IF you received labwork today, you will receive an invoice from [place]. Please contact [facility] at [phone] with questions or concerns regarding your invoice.   Our billing staff will not be able to assist you with questions regarding bills from these companies.  You will be contacted with the lab results as soon as they are available. The fastest way to  get your results is to activate your My Chart account. Instructions are located on the last page of this paperwork. If you have not heard from [place] regarding the results in 2 weeks, please contact this office.         Signed, [name], MD [facility]

## 2020-03-14 NOTE — Patient Instructions (Addendum)
  I will refer you to OB/GYN for repeat exam and discussion of pelvic organ prolapse, along with possible treatment options.  Stool softener over-the-counter can help if you are having hard stools.  Continue hydrochlorothiazide at 12.5 mg dose, I sent that new prescription to your pharmacy.  Continue Crestor same dose.  Labs today, follow-up within 6 months for wellness exam.   Let me know if there are any questions and take care   If you have lab work done today you will be contacted with your lab results within the next 2 weeks.  If you have not heard from [place] then please contact us. The fastest way to get your results is to register for My Chart.   IF you received an x-ray today, you will receive an invoice from [facility]. Please contact [facility] Radiology at [phone] with questions or concerns regarding your invoice.   IF you received labwork today, you will receive an invoice from [place]. Please contact [facility] at [phone] with questions or concerns regarding your invoice.   Our billing staff will not be able to assist you with questions regarding bills from these companies.  You will be contacted with the lab results as soon as they are available. The fastest way to get your results is to activate your My Chart account. Instructions are located on the last page of this paperwork. If you have not heard from [place] regarding the results in 2 weeks, please contact this office.

## 2020-03-15 LAB — COMPREHENSIVE METABOLIC PANEL
ALT: 18 IU/L (ref 0–32)
AST: 23 IU/L (ref 0–40)
Albumin/Globulin Ratio: 1.6 (ref 1.2–2.2)
Albumin: 4.3 g/dL (ref 3.8–4.8)
Alkaline Phosphatase: 73 IU/L (ref 48–121)
BUN/Creatinine Ratio: 14 (ref 12–28)
BUN: 11 mg/dL (ref 8–27)
Bilirubin Total: 0.5 mg/dL (ref 0.0–1.2)
CO2: 22 mmol/L (ref 20–29)
Calcium: 10.1 mg/dL (ref 8.7–10.3)
Chloride: 103 mmol/L (ref 96–106)
Creatinine, Ser: 0.81 mg/dL (ref 0.57–1.00)
GFR calc Af Amer: 87 mL/min/{1.73_m2} (ref >59)
GFR calc non Af Amer: 75 mL/min/{1.73_m2} (ref >59)
Globulin, Total: 2.7 g/dL (ref 1.5–4.5)
Glucose: 92 mg/dL (ref 65–99)
Potassium: 4.4 mmol/L (ref 3.5–5.2)
Sodium: 140 mmol/L (ref 134–144)
Total Protein: 7 g/dL (ref 6.0–8.5)

## 2020-03-15 LAB — LIPID PANEL
Chol/HDL Ratio: 2.4 ratio (ref 0.0–4.4)
Cholesterol, Total: 164 mg/dL (ref 100–199)
HDL: 69 mg/dL (ref >39)
LDL Chol Calc (NIH): 74 mg/dL (ref 0–99)
Triglycerides: 117 mg/dL (ref 0–149)
VLDL Cholesterol Cal: 21 mg/dL (ref 5–40)

## 2020-03-24 DIAGNOSIS — N816 Rectocele: Secondary | ICD-10-CM | POA: Diagnosis not present

## 2020-03-31 DIAGNOSIS — Z20822 Contact with and (suspected) exposure to covid-19: Secondary | ICD-10-CM | POA: Diagnosis not present

## 2020-03-31 DIAGNOSIS — Z03818 Encounter for observation for suspected exposure to other biological agents ruled out: Secondary | ICD-10-CM | POA: Diagnosis not present

## 2020-05-12 DIAGNOSIS — Z03818 Encounter for observation for suspected exposure to other biological agents ruled out: Secondary | ICD-10-CM | POA: Diagnosis not present

## 2020-05-12 DIAGNOSIS — Z20822 Contact with and (suspected) exposure to covid-19: Secondary | ICD-10-CM | POA: Diagnosis not present

## 2020-08-22 ENCOUNTER — Other Ambulatory Visit: Payer: Self-pay

## 2020-08-22 ENCOUNTER — Ambulatory Visit (INDEPENDENT_AMBULATORY_CARE_PROVIDER_SITE_OTHER): Payer: Medicare HMO | Admitting: Family Medicine

## 2020-08-22 DIAGNOSIS — Z23 Encounter for immunization: Secondary | ICD-10-CM | POA: Diagnosis not present

## 2020-08-22 NOTE — Progress Notes (Signed)
HD Flu shot tolerated well.

## 2020-09-12 ENCOUNTER — Encounter: Payer: Medicare HMO | Admitting: Family Medicine

## 2020-09-13 DIAGNOSIS — Z1231 Encounter for screening mammogram for malignant neoplasm of breast: Secondary | ICD-10-CM | POA: Diagnosis not present

## 2020-09-13 LAB — HM MAMMOGRAPHY

## 2020-09-22 ENCOUNTER — Encounter: Payer: Self-pay | Admitting: Family Medicine

## 2020-09-22 DIAGNOSIS — R928 Other abnormal and inconclusive findings on diagnostic imaging of breast: Secondary | ICD-10-CM

## 2020-09-22 NOTE — Telephone Encounter (Signed)
Unsure what they need as we havent ordered a mammo for her in 1 year? Please advise?

## 2020-09-23 NOTE — Telephone Encounter (Signed)
In care everywhere, 09/13/20 mammogram:  "FINDINGS:  In the left breast, a possible mass or developing asymmetry warrants  further evaluation. In the right breast, no findings suspicious for  malignancy. Images were processed with CAD.  IMPRESSION:  Further evaluation is suggested for a possible mass or developing  asymmetry in the left breast.  RECOMMENDATION:  Diagnostic mammogram and possibly ultrasound of the left breast.  (Code:[id number])  The patient will be contacted regarding the findings, and additional  imaging will be scheduled.  BI-RADS CATEGORY 0: Incomplete. Need additional imaging evaluation  and/or prior mammograms for comparison."    Please call and verbal order whatever study they need - diagnostic mammogram and ultrasound of left breast based on notes above. I did not place order in Epic as not sure if it is needed a specific way for their imaging facility. Let me know if I need to do anything else, or sign orders. Thanks.

## 2020-10-05 ENCOUNTER — Telehealth: Payer: Self-pay | Admitting: Family Medicine

## 2020-10-05 NOTE — Telephone Encounter (Signed)
Pt called and stated she would like the referral to be sent to [facility] stated that is where she gets hers mammograms done for the past several years. Please advise.

## 2020-10-17 ENCOUNTER — Other Ambulatory Visit: Payer: Self-pay | Admitting: Family Medicine

## 2020-10-24 ENCOUNTER — Other Ambulatory Visit: Payer: Self-pay

## 2020-10-24 ENCOUNTER — Encounter: Payer: Self-pay | Admitting: Family Medicine

## 2020-10-24 ENCOUNTER — Ambulatory Visit (INDEPENDENT_AMBULATORY_CARE_PROVIDER_SITE_OTHER): Payer: Medicare HMO | Admitting: Family Medicine

## 2020-10-24 VITALS — BP 139/88 | HR 90 | Temp 95.0°F | Ht 61.0 in | Wt 175.0 lb

## 2020-10-24 DIAGNOSIS — E2839 Other primary ovarian failure: Secondary | ICD-10-CM

## 2020-10-24 DIAGNOSIS — Z13820 Encounter for screening for osteoporosis: Secondary | ICD-10-CM

## 2020-10-24 DIAGNOSIS — E785 Hyperlipidemia, unspecified: Secondary | ICD-10-CM | POA: Diagnosis not present

## 2020-10-24 DIAGNOSIS — Z0001 Encounter for general adult medical examination with abnormal findings: Secondary | ICD-10-CM

## 2020-10-24 DIAGNOSIS — I10 Essential (primary) hypertension: Secondary | ICD-10-CM

## 2020-10-24 DIAGNOSIS — Z0000 Encounter for general adult medical examination without abnormal findings: Secondary | ICD-10-CM

## 2020-10-24 MED ORDER — HYDROCHLOROTHIAZIDE 12.5 MG PO CAPS
12.5000 mg | ORAL_CAPSULE | Freq: Every day | ORAL | 3 refills | Status: DC
Start: 2020-10-24 — End: 2020-12-20

## 2020-10-24 MED ORDER — ROSUVASTATIN CALCIUM 10 MG PO TABS: 10.0000 mg | ORAL_TABLET | Freq: Every day | ORAL | 3 refills | Status: DC

## 2020-10-24 NOTE — Patient Instructions (Addendum)
No med changes today.  Follow up for physical in 1 year, but let me know if there are questions.    If you have lab work done today you will be contacted with your lab results within the next 2 weeks.  If you have not heard from [place] then please contact us. The fastest way to get your results is to register for My Chart.   IF you received an x-ray today, you will receive an invoice from [facility]. Please contact [facility] at [phone] with questions or concerns regarding your invoice.   IF you received labwork today, you will receive an invoice from [place]. Please contact [facility] at [phone] with questions or concerns regarding your invoice.   Our billing staff will not be able to assist you with questions regarding bills from these companies.  You will be contacted with the lab results as soon as they are available. The fastest way to get your results is to activate your My Chart account. Instructions are located on the last page of this paperwork. If you have not heard from [place] regarding the results in 2 weeks, please contact this office.
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
## 2020-10-25 ENCOUNTER — Encounter: Payer: Self-pay | Admitting: Family Medicine

## 2020-10-25 LAB — COMPREHENSIVE METABOLIC PANEL
ALT: 22 IU/L (ref 0–32)
AST: 26 IU/L (ref 0–40)
Albumin/Globulin Ratio: 1.6 (ref 1.2–2.2)
Albumin: 4.5 g/dL (ref 3.8–4.8)
Alkaline Phosphatase: 80 IU/L (ref 44–121)
BUN/Creatinine Ratio: 19 (ref 12–28)
BUN: 16 mg/dL (ref 8–27)
Bilirubin Total: 0.5 mg/dL (ref 0.0–1.2)
CO2: 26 mmol/L (ref 20–29)
Calcium: 10.5 mg/dL — ABNORMAL HIGH (ref 8.7–10.3)
Chloride: 104 mmol/L (ref 96–106)
Creatinine, Ser: 0.85 mg/dL (ref 0.57–1.00)
GFR calc Af Amer: 81 mL/min/{1.73_m2} (ref >59)
GFR calc non Af Amer: 71 mL/min/{1.73_m2} (ref >59)
Globulin, Total: 2.8 g/dL (ref 1.5–4.5)
Glucose: 95 mg/dL (ref 65–99)
Potassium: 4.5 mmol/L (ref 3.5–5.2)
Sodium: 143 mmol/L (ref 134–144)
Total Protein: 7.3 g/dL (ref 6.0–8.5)

## 2020-10-25 LAB — LIPID PANEL
Chol/HDL Ratio: 2.5 ratio (ref 0.0–4.4)
Cholesterol, Total: 180 mg/dL (ref 100–199)
HDL: 71 mg/dL (ref >39)
LDL Chol Calc (NIH): 94 mg/dL (ref 0–99)
Triglycerides: 84 mg/dL (ref 0–149)
VLDL Cholesterol Cal: 15 mg/dL (ref 5–40)

## 2020-10-27 DIAGNOSIS — Z1231 Encounter for screening mammogram for malignant neoplasm of breast: Secondary | ICD-10-CM | POA: Diagnosis not present

## 2020-10-27 DIAGNOSIS — R922 Inconclusive mammogram: Secondary | ICD-10-CM | POA: Diagnosis not present

## 2020-10-27 DIAGNOSIS — R928 Other abnormal and inconclusive findings on diagnostic imaging of breast: Secondary | ICD-10-CM | POA: Diagnosis not present

## 2020-11-16 ENCOUNTER — Encounter: Payer: Self-pay | Admitting: Family Medicine

## 2020-12-01 ENCOUNTER — Ambulatory Visit: Payer: Medicare HMO | Admitting: Family Medicine

## 2020-12-06 ENCOUNTER — Ambulatory Visit: Payer: Medicare HMO

## 2020-12-06 ENCOUNTER — Other Ambulatory Visit: Payer: Self-pay

## 2020-12-06 DIAGNOSIS — I10 Essential (primary) hypertension: Secondary | ICD-10-CM

## 2020-12-07 ENCOUNTER — Encounter: Payer: Self-pay | Admitting: Family Medicine

## 2020-12-07 LAB — CMP14+EGFR
ALT: 24 IU/L (ref 0–32)
AST: 29 IU/L (ref 0–40)
Albumin/Globulin Ratio: 1.9 (ref 1.2–2.2)
Albumin: 4.7 g/dL (ref 3.8–4.8)
Alkaline Phosphatase: 78 IU/L (ref 44–121)
BUN/Creatinine Ratio: 17 (ref 12–28)
BUN: 16 mg/dL (ref 8–27)
Bilirubin Total: 0.5 mg/dL (ref 0.0–1.2)
CO2: 24 mmol/L (ref 20–29)
Calcium: 10 mg/dL (ref 8.7–10.3)
Chloride: 102 mmol/L (ref 96–106)
Creatinine, Ser: 0.94 mg/dL (ref 0.57–1.00)
Globulin, Total: 2.5 g/dL (ref 1.5–4.5)
Glucose: 98 mg/dL (ref 65–99)
Potassium: 4.2 mmol/L (ref 3.5–5.2)
Sodium: 140 mmol/L (ref 134–144)
Total Protein: 7.2 g/dL (ref 6.0–8.5)
eGFR: 66 mL/min/{1.73_m2} (ref >59)

## 2020-12-14 NOTE — Telephone Encounter (Signed)
Noted. Consider change - amlodipine? Sent message to pt on lab note.

## 2020-12-20 ENCOUNTER — Other Ambulatory Visit: Payer: Self-pay

## 2020-12-20 DIAGNOSIS — I10 Essential (primary) hypertension: Secondary | ICD-10-CM

## 2020-12-20 MED ORDER — HYDROCHLOROTHIAZIDE 12.5 MG PO CAPS: 12.5000 mg | ORAL_CAPSULE | Freq: Every day | ORAL | 3 refills | Status: DC

## 2021-03-14 ENCOUNTER — Other Ambulatory Visit: Payer: Self-pay

## 2021-03-14 ENCOUNTER — Ambulatory Visit
Admission: RE | Admit: 2021-03-14 | Discharge: 2021-03-14 | Disposition: A | Discharge status: Home / Self Care | Payer: Medicare HMO | Source: Ambulatory Visit | Attending: Family Medicine | Admitting: Family Medicine

## 2021-03-14 DIAGNOSIS — Z780 Asymptomatic menopausal state: Secondary | ICD-10-CM | POA: Diagnosis not present

## 2021-03-14 DIAGNOSIS — Z13820 Encounter for screening for osteoporosis: Secondary | ICD-10-CM

## 2021-03-14 DIAGNOSIS — E2839 Other primary ovarian failure: Secondary | ICD-10-CM

## 2021-04-13 DIAGNOSIS — N8189 Other female genital prolapse: Secondary | ICD-10-CM | POA: Diagnosis not present

## 2021-04-15 ENCOUNTER — Emergency Department (HOSPITAL_COMMUNITY)
Admission: EM | Admit: 2021-04-15 | Discharge: 2021-04-16 | Disposition: A | Discharge status: Home / Self Care | Payer: Medicare HMO | Attending: Emergency Medicine | Admitting: Emergency Medicine

## 2021-04-15 ENCOUNTER — Emergency Department (HOSPITAL_COMMUNITY): Payer: Medicare HMO

## 2021-04-15 DIAGNOSIS — M1990 Unspecified osteoarthritis, unspecified site: Secondary | ICD-10-CM

## 2021-04-15 DIAGNOSIS — J3489 Other specified disorders of nose and nasal sinuses: Secondary | ICD-10-CM | POA: Diagnosis not present

## 2021-04-15 DIAGNOSIS — R000 Tachycardia, unspecified: Secondary | ICD-10-CM | POA: Diagnosis not present

## 2021-04-15 DIAGNOSIS — R079 Chest pain, unspecified: Secondary | ICD-10-CM | POA: Insufficient documentation

## 2021-04-15 DIAGNOSIS — R52 Pain, unspecified: Secondary | ICD-10-CM

## 2021-04-15 DIAGNOSIS — S80811A Abrasion, right lower leg, initial encounter: Secondary | ICD-10-CM | POA: Diagnosis not present

## 2021-04-15 DIAGNOSIS — Z743 Need for continuous supervision: Secondary | ICD-10-CM | POA: Diagnosis not present

## 2021-04-15 DIAGNOSIS — M25532 Pain in left wrist: Secondary | ICD-10-CM | POA: Diagnosis not present

## 2021-04-15 DIAGNOSIS — Z041 Encounter for examination and observation following transport accident: Secondary | ICD-10-CM | POA: Diagnosis not present

## 2021-04-15 DIAGNOSIS — R0902 Hypoxemia: Secondary | ICD-10-CM | POA: Diagnosis not present

## 2021-04-15 DIAGNOSIS — S8991XA Unspecified injury of right lower leg, initial encounter: Secondary | ICD-10-CM | POA: Diagnosis not present

## 2021-04-15 DIAGNOSIS — S0990XA Unspecified injury of head, initial encounter: Secondary | ICD-10-CM | POA: Insufficient documentation

## 2021-04-15 DIAGNOSIS — T148XXA Other injury of unspecified body region, initial encounter: Secondary | ICD-10-CM

## 2021-04-15 DIAGNOSIS — M25552 Pain in left hip: Secondary | ICD-10-CM | POA: Diagnosis not present

## 2021-04-15 DIAGNOSIS — I10 Essential (primary) hypertension: Secondary | ICD-10-CM | POA: Diagnosis not present

## 2021-04-15 DIAGNOSIS — M25571 Pain in right ankle and joints of right foot: Secondary | ICD-10-CM | POA: Diagnosis not present

## 2021-04-15 DIAGNOSIS — Y92410 Unspecified street and highway as the place of occurrence of the external cause: Secondary | ICD-10-CM | POA: Insufficient documentation

## 2021-04-15 DIAGNOSIS — S60812A Abrasion of left wrist, initial encounter: Secondary | ICD-10-CM | POA: Diagnosis not present

## 2021-04-15 DIAGNOSIS — M79641 Pain in right hand: Secondary | ICD-10-CM | POA: Insufficient documentation

## 2021-04-15 DIAGNOSIS — Z79899 Other long term (current) drug therapy: Secondary | ICD-10-CM | POA: Insufficient documentation

## 2021-04-15 MED ORDER — ACETAMINOPHEN 500 MG PO TABS
1000.0000 mg | ORAL_TABLET | Freq: Once | ORAL | Status: DC
  Filled 2021-04-15: qty 2
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
## 2021-04-15 NOTE — ED Notes (Signed)
Pt sitting at windows by lobby security desk

## 2021-04-16 ENCOUNTER — Emergency Department (HOSPITAL_COMMUNITY): Payer: Medicare HMO

## 2021-04-16 ENCOUNTER — Encounter (HOSPITAL_COMMUNITY): Payer: Self-pay | Admitting: Emergency Medicine

## 2021-04-16 DIAGNOSIS — M25571 Pain in right ankle and joints of right foot: Secondary | ICD-10-CM | POA: Diagnosis not present

## 2021-04-16 DIAGNOSIS — M25532 Pain in left wrist: Secondary | ICD-10-CM | POA: Diagnosis not present

## 2021-04-16 MED ORDER — LIDOCAINE 5 % EX PTCH
3.0000 | MEDICATED_PATCH | CUTANEOUS | Status: DC
  Filled 2021-04-16: qty 3

## 2021-04-16 MED ORDER — LIDOCAINE 5 % EX PTCH: 1.0000 | MEDICATED_PATCH | CUTANEOUS | 0 refills | Status: DC

## 2021-04-16 MED ORDER — KETOROLAC TROMETHAMINE 60 MG/2ML IM SOLN
30.0000 mg | Freq: Once | INTRAMUSCULAR | Status: AC
  Administered 2021-04-16: 30 mg via INTRAMUSCULAR
  Filled 2021-04-16: qty 2

## 2021-04-16 MED ORDER — DICLOFENAC SODIUM ER 100 MG PO TB24: 100.0000 mg | ORAL_TABLET | Freq: Every day | ORAL | 0 refills | Status: DC

## 2021-04-16 NOTE — ED Notes (Signed)
Band-Aid applied to right shin per Dr. Randal Buba.
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
## 2021-04-19 DIAGNOSIS — M25571 Pain in right ankle and joints of right foot: Secondary | ICD-10-CM | POA: Diagnosis not present

## 2021-04-19 DIAGNOSIS — M79641 Pain in right hand: Secondary | ICD-10-CM | POA: Diagnosis not present

## 2021-04-28 DIAGNOSIS — M25571 Pain in right ankle and joints of right foot: Secondary | ICD-10-CM | POA: Diagnosis not present

## 2021-05-03 DIAGNOSIS — N8189 Other female genital prolapse: Secondary | ICD-10-CM | POA: Diagnosis not present

## 2021-05-03 DIAGNOSIS — N816 Rectocele: Secondary | ICD-10-CM | POA: Diagnosis not present

## 2021-05-19 DIAGNOSIS — M79661 Pain in right lower leg: Secondary | ICD-10-CM | POA: Diagnosis not present

## 2021-05-19 DIAGNOSIS — M25561 Pain in right knee: Secondary | ICD-10-CM | POA: Diagnosis not present

## 2021-05-30 DIAGNOSIS — M25571 Pain in right ankle and joints of right foot: Secondary | ICD-10-CM | POA: Diagnosis not present

## 2021-05-30 DIAGNOSIS — M79671 Pain in right foot: Secondary | ICD-10-CM | POA: Diagnosis not present

## 2021-05-30 DIAGNOSIS — M25561 Pain in right knee: Secondary | ICD-10-CM | POA: Diagnosis not present

## 2021-06-14 DIAGNOSIS — M25571 Pain in right ankle and joints of right foot: Secondary | ICD-10-CM | POA: Diagnosis not present

## 2021-06-19 DIAGNOSIS — M84371S Stress fracture, right ankle, sequela: Secondary | ICD-10-CM | POA: Diagnosis not present

## 2021-06-19 DIAGNOSIS — S93491D Sprain of other ligament of right ankle, subsequent encounter: Secondary | ICD-10-CM | POA: Diagnosis not present

## 2021-06-19 DIAGNOSIS — R531 Weakness: Secondary | ICD-10-CM | POA: Diagnosis not present

## 2021-06-19 DIAGNOSIS — R269 Unspecified abnormalities of gait and mobility: Secondary | ICD-10-CM | POA: Diagnosis not present

## 2021-06-21 DIAGNOSIS — S93491D Sprain of other ligament of right ankle, subsequent encounter: Secondary | ICD-10-CM | POA: Diagnosis not present

## 2021-06-21 DIAGNOSIS — R269 Unspecified abnormalities of gait and mobility: Secondary | ICD-10-CM | POA: Diagnosis not present

## 2021-06-21 DIAGNOSIS — M84371S Stress fracture, right ankle, sequela: Secondary | ICD-10-CM | POA: Diagnosis not present

## 2021-06-21 DIAGNOSIS — R531 Weakness: Secondary | ICD-10-CM | POA: Diagnosis not present

## 2021-06-26 DIAGNOSIS — R269 Unspecified abnormalities of gait and mobility: Secondary | ICD-10-CM | POA: Diagnosis not present

## 2021-06-26 DIAGNOSIS — S93491D Sprain of other ligament of right ankle, subsequent encounter: Secondary | ICD-10-CM | POA: Diagnosis not present

## 2021-06-26 DIAGNOSIS — R531 Weakness: Secondary | ICD-10-CM | POA: Diagnosis not present

## 2021-06-26 DIAGNOSIS — M84371S Stress fracture, right ankle, sequela: Secondary | ICD-10-CM | POA: Diagnosis not present

## 2021-06-28 DIAGNOSIS — S93491D Sprain of other ligament of right ankle, subsequent encounter: Secondary | ICD-10-CM | POA: Diagnosis not present

## 2021-06-28 DIAGNOSIS — R531 Weakness: Secondary | ICD-10-CM | POA: Diagnosis not present

## 2021-06-28 DIAGNOSIS — R269 Unspecified abnormalities of gait and mobility: Secondary | ICD-10-CM | POA: Diagnosis not present

## 2021-06-28 DIAGNOSIS — M84371S Stress fracture, right ankle, sequela: Secondary | ICD-10-CM | POA: Diagnosis not present

## 2021-07-03 DIAGNOSIS — S93491D Sprain of other ligament of right ankle, subsequent encounter: Secondary | ICD-10-CM | POA: Diagnosis not present

## 2021-07-03 DIAGNOSIS — R531 Weakness: Secondary | ICD-10-CM | POA: Diagnosis not present

## 2021-07-03 DIAGNOSIS — M84371S Stress fracture, right ankle, sequela: Secondary | ICD-10-CM | POA: Diagnosis not present

## 2021-07-03 DIAGNOSIS — R269 Unspecified abnormalities of gait and mobility: Secondary | ICD-10-CM | POA: Diagnosis not present

## 2021-07-05 DIAGNOSIS — M84371S Stress fracture, right ankle, sequela: Secondary | ICD-10-CM | POA: Diagnosis not present

## 2021-07-05 DIAGNOSIS — R269 Unspecified abnormalities of gait and mobility: Secondary | ICD-10-CM | POA: Diagnosis not present

## 2021-07-05 DIAGNOSIS — S93491D Sprain of other ligament of right ankle, subsequent encounter: Secondary | ICD-10-CM | POA: Diagnosis not present

## 2021-07-05 DIAGNOSIS — R531 Weakness: Secondary | ICD-10-CM | POA: Diagnosis not present

## 2021-07-10 DIAGNOSIS — R269 Unspecified abnormalities of gait and mobility: Secondary | ICD-10-CM | POA: Diagnosis not present

## 2021-07-10 DIAGNOSIS — R531 Weakness: Secondary | ICD-10-CM | POA: Diagnosis not present

## 2021-07-10 DIAGNOSIS — S93491D Sprain of other ligament of right ankle, subsequent encounter: Secondary | ICD-10-CM | POA: Diagnosis not present

## 2021-07-10 DIAGNOSIS — M84371S Stress fracture, right ankle, sequela: Secondary | ICD-10-CM | POA: Diagnosis not present

## 2021-07-12 DIAGNOSIS — M84371S Stress fracture, right ankle, sequela: Secondary | ICD-10-CM | POA: Diagnosis not present

## 2021-07-12 DIAGNOSIS — R531 Weakness: Secondary | ICD-10-CM | POA: Diagnosis not present

## 2021-07-12 DIAGNOSIS — S93491D Sprain of other ligament of right ankle, subsequent encounter: Secondary | ICD-10-CM | POA: Diagnosis not present

## 2021-07-12 DIAGNOSIS — R269 Unspecified abnormalities of gait and mobility: Secondary | ICD-10-CM | POA: Diagnosis not present

## 2021-07-17 DIAGNOSIS — R269 Unspecified abnormalities of gait and mobility: Secondary | ICD-10-CM | POA: Diagnosis not present

## 2021-07-17 DIAGNOSIS — R531 Weakness: Secondary | ICD-10-CM | POA: Diagnosis not present

## 2021-07-17 DIAGNOSIS — M84371S Stress fracture, right ankle, sequela: Secondary | ICD-10-CM | POA: Diagnosis not present

## 2021-07-17 DIAGNOSIS — S93491D Sprain of other ligament of right ankle, subsequent encounter: Secondary | ICD-10-CM | POA: Diagnosis not present

## 2021-07-19 DIAGNOSIS — M84371D Stress fracture, right ankle, subsequent encounter for fracture with routine healing: Secondary | ICD-10-CM | POA: Diagnosis not present

## 2021-07-19 DIAGNOSIS — R531 Weakness: Secondary | ICD-10-CM | POA: Diagnosis not present

## 2021-07-19 DIAGNOSIS — S93491D Sprain of other ligament of right ankle, subsequent encounter: Secondary | ICD-10-CM | POA: Diagnosis not present

## 2021-07-19 DIAGNOSIS — M84371S Stress fracture, right ankle, sequela: Secondary | ICD-10-CM | POA: Diagnosis not present

## 2021-07-19 DIAGNOSIS — R269 Unspecified abnormalities of gait and mobility: Secondary | ICD-10-CM | POA: Diagnosis not present

## 2021-07-24 DIAGNOSIS — M84371S Stress fracture, right ankle, sequela: Secondary | ICD-10-CM | POA: Diagnosis not present

## 2021-07-24 DIAGNOSIS — S93491D Sprain of other ligament of right ankle, subsequent encounter: Secondary | ICD-10-CM | POA: Diagnosis not present

## 2021-07-24 DIAGNOSIS — R269 Unspecified abnormalities of gait and mobility: Secondary | ICD-10-CM | POA: Diagnosis not present

## 2021-07-24 DIAGNOSIS — R531 Weakness: Secondary | ICD-10-CM | POA: Diagnosis not present

## 2021-07-26 DIAGNOSIS — R269 Unspecified abnormalities of gait and mobility: Secondary | ICD-10-CM | POA: Diagnosis not present

## 2021-07-26 DIAGNOSIS — R531 Weakness: Secondary | ICD-10-CM | POA: Diagnosis not present

## 2021-07-26 DIAGNOSIS — S93491D Sprain of other ligament of right ankle, subsequent encounter: Secondary | ICD-10-CM | POA: Diagnosis not present

## 2021-07-26 DIAGNOSIS — M84371S Stress fracture, right ankle, sequela: Secondary | ICD-10-CM | POA: Diagnosis not present

## 2021-08-12 ENCOUNTER — Other Ambulatory Visit: Payer: Self-pay | Admitting: Family Medicine

## 2021-08-12 DIAGNOSIS — I10 Essential (primary) hypertension: Secondary | ICD-10-CM

## 2021-09-13 ENCOUNTER — Other Ambulatory Visit: Payer: Self-pay | Admitting: Family Medicine

## 2021-09-13 DIAGNOSIS — I10 Essential (primary) hypertension: Secondary | ICD-10-CM

## 2021-09-20 ENCOUNTER — Encounter: Payer: Self-pay | Admitting: Family Medicine

## 2021-09-20 DIAGNOSIS — Z20828 Contact with and (suspected) exposure to other viral communicable diseases: Secondary | ICD-10-CM

## 2021-09-20 MED ORDER — OSELTAMIVIR PHOSPHATE 75 MG PO CAPS: 75.0000 mg | ORAL_CAPSULE | Freq: Every day | ORAL | 0 refills | Status: DC

## 2021-09-21 NOTE — Telephone Encounter (Signed)
Can you take a look at medication below patient is asking for Tamiflu medication. Please Advise

## 2021-10-17 ENCOUNTER — Other Ambulatory Visit: Payer: Self-pay | Admitting: Family Medicine

## 2021-10-17 DIAGNOSIS — I10 Essential (primary) hypertension: Secondary | ICD-10-CM

## 2021-10-26 ENCOUNTER — Ambulatory Visit (INDEPENDENT_AMBULATORY_CARE_PROVIDER_SITE_OTHER): Payer: Medicare HMO | Admitting: Family Medicine

## 2021-10-26 ENCOUNTER — Encounter: Payer: Self-pay | Admitting: Family Medicine

## 2021-10-26 VITALS — BP 122/80 | HR 74 | Temp 98.3°F | Resp 17 | Ht 61.0 in | Wt 167.2 lb

## 2021-10-26 DIAGNOSIS — Z0000 Encounter for general adult medical examination without abnormal findings: Secondary | ICD-10-CM | POA: Diagnosis not present

## 2021-10-26 DIAGNOSIS — Z131 Encounter for screening for diabetes mellitus: Secondary | ICD-10-CM

## 2021-10-26 DIAGNOSIS — Z130 Encounter for screening for diseases of the blood and blood-forming organs and certain disorders involving the immune mechanism: Secondary | ICD-10-CM | POA: Diagnosis not present

## 2021-10-26 DIAGNOSIS — I10 Essential (primary) hypertension: Secondary | ICD-10-CM

## 2021-10-26 DIAGNOSIS — E785 Hyperlipidemia, unspecified: Secondary | ICD-10-CM | POA: Diagnosis not present

## 2021-10-26 LAB — COMPREHENSIVE METABOLIC PANEL
ALT: 16 U/L (ref 0–35)
AST: 21 U/L (ref 0–37)
Albumin: 4.2 g/dL (ref 3.5–5.2)
Alkaline Phosphatase: 61 U/L (ref 39–117)
BUN: 17 mg/dL (ref 6–23)
CO2: 32 mEq/L (ref 19–32)
Calcium: 9.8 mg/dL (ref 8.4–10.5)
Chloride: 103 mEq/L (ref 96–112)
Creatinine, Ser: 0.84 mg/dL (ref 0.40–1.20)
GFR: 71.06 mL/min (ref >60.00)
Glucose, Bld: 94 mg/dL (ref 70–99)
Potassium: 4 mEq/L (ref 3.5–5.1)
Sodium: 140 mEq/L (ref 135–145)
Total Bilirubin: 0.6 mg/dL (ref 0.2–1.2)
Total Protein: 6.7 g/dL (ref 6.0–8.3)

## 2021-10-26 LAB — CBC WITH DIFFERENTIAL/PLATELET
Basophils Absolute: 0.1 10*3/uL (ref 0.0–0.1)
Basophils Relative: 1.1 % (ref 0.0–3.0)
Eosinophils Absolute: 0.3 10*3/uL (ref 0.0–0.7)
Eosinophils Relative: 6.1 % — ABNORMAL HIGH (ref 0.0–5.0)
HCT: 43.6 % (ref 36.0–46.0)
Hemoglobin: 14.3 g/dL (ref 12.0–15.0)
Lymphocytes Relative: 27.8 % (ref 12.0–46.0)
Lymphs Abs: 1.5 10*3/uL (ref 0.7–4.0)
MCHC: 32.8 g/dL (ref 30.0–36.0)
MCV: 89.6 fl (ref 78.0–100.0)
Monocytes Absolute: 0.5 10*3/uL (ref 0.1–1.0)
Monocytes Relative: 8.4 % (ref 3.0–12.0)
Neutro Abs: 3.2 10*3/uL (ref 1.4–7.7)
Neutrophils Relative %: 56.6 % (ref 43.0–77.0)
Platelets: 239 10*3/uL (ref 150.0–400.0)
RBC: 4.87 Mil/uL (ref 3.87–5.11)
RDW: 13.5 % (ref 11.5–15.5)
WBC: 5.6 10*3/uL (ref 4.0–10.5)

## 2021-10-26 LAB — LIPID PANEL
Cholesterol: 178 mg/dL (ref 0–200)
HDL: 68.1 mg/dL (ref >39.00)
LDL Cholesterol: 88 mg/dL (ref 0–99)
NonHDL: 110.06
Total CHOL/HDL Ratio: 3
Triglycerides: 111 mg/dL (ref 0.0–149.0)
VLDL: 22.2 mg/dL (ref 0.0–40.0)

## 2021-10-26 LAB — HEMOGLOBIN A1C: Hgb A1c MFr Bld: 6.4 % (ref 4.6–6.5)

## 2021-10-26 MED ORDER — HYDROCHLOROTHIAZIDE 12.5 MG PO CAPS: 12.5000 mg | ORAL_CAPSULE | Freq: Every day | ORAL | 3 refills | Status: DC

## 2021-10-26 MED ORDER — ROSUVASTATIN CALCIUM 10 MG PO TABS: 10.0000 mg | ORAL_TABLET | Freq: Every day | ORAL | 3 refills | Status: DC

## 2021-10-26 NOTE — Patient Instructions (Addendum)
We recommend that you schedule a mammogram for breast cancer screening. Typically, you do not need a referral to do this. Please contact a local imaging center to schedule your mammogram.  [facility] - [phone]  *ask for the Radiology Department [facility] ([place]) - [phone] or [phone]  [facility] - [phone] [place] [phone] [facility] - [phone]  *ask for the [facility] - [phone]  *ask for the Radiology Department [facility] - [phone]  *ask for the [facility] - [phone]  Goal exercise 166mn per week. Start low and go slow with amount and intensity.  No med changes at this time. If any concerns on labs I will let you know.  Tell the family hello.   Preventive Care 626Years and Older, Female Preventive care refers to lifestyle choices and visits with your health care provider that can promote health and wellness. Preventive care visits are also called wellness exams. What can I expect for my preventive care visit? Counseling Your health care provider may ask you questions about your: Medical history, including: Past medical problems. Family medical history. Pregnancy and menstrual history. History of falls. Current health, including: Memory and ability to understand (cognition). Emotional well-being. Home life and relationship well-being. Sexual activity and sexual health. Lifestyle, including: Alcohol, nicotine or tobacco, and drug use. Access to firearms. Diet, exercise, and sleep habits. Work and work [employer] Sun[employer] use. Safety issues such as seatbelt and bike helmet use. Physical exam Your health care provider will check your: Height and weight. These may be used to calculate your BMI (body mass index). BMI is a measurement that tells if you are at a healthy  weight. Waist circumference. This measures the distance around your waistline. This measurement also tells if you are at a healthy weight and may help predict your risk of certain diseases, such as type 2 diabetes and high blood pressure. Heart rate and blood pressure. Body temperature. Skin for abnormal spots. What immunizations do I need? Vaccines are usually given at various ages, according to a schedule. Your health care provider will recommend vaccines for you based on your age, medical history, and lifestyle or other factors, such as travel or where you work. What tests do I need? Screening Your health care provider may recommend screening tests for certain conditions. This may include: Lipid and cholesterol levels. Hepatitis C test. Hepatitis B test. HIV (human immunodeficiency virus) test. STI (sexually transmitted infection) testing, if you are at risk. Lung cancer screening. Colorectal cancer screening. Diabetes screening. This is done by checking your blood sugar (glucose) after you have not eaten for a while (fasting). Mammogram. Talk with your health care provider about how often you should have regular mammograms. BRCA-related cancer screening. This may be done if you have a family history of breast, ovarian, tubal, or peritoneal cancers. Bone density scan. This is done to screen for osteoporosis. Talk with your health care provider about your test results, treatment options, and if necessary, the need for more tests. Follow these instructions at home: Eating and drinking  Eat a diet that includes fresh fruits and vegetables, whole grains, lean protein, and low-fat dairy products. Limit your intake of foods with high amounts of sugar, saturated fats, and salt. Take vitamin and mineral supplements as recommended by your health care provider. Do not drink alcohol if your health care provider tells you not to  drink. If you drink alcohol: Limit how much you have to 0-1 drink a  day. Know how much alcohol is in your drink. In the [place] one drink equals one 12 oz bottle of beer (355 mL), one 5 oz glass of wine (148 mL), or one 1 oz glass of hard liquor (44 mL). Lifestyle Brush your teeth every morning and night with fluoride toothpaste. Floss one time each day. Exercise for at least 30 minutes 5 or more days each week. Do not use any products that contain nicotine or tobacco. These products include cigarettes, chewing tobacco, and vaping devices, such as e-cigarettes. If you need help quitting, ask your health care provider. Do not use drugs. If you are sexually active, practice safe sex. Use a condom or other form of protection in order to prevent STIs. Take aspirin only as told by your health care provider. Make sure that you understand how much to take and what form to take. Work with your health care provider to find out whether it is safe and beneficial for you to take aspirin daily. Ask your health care provider if you need to take a cholesterol-lowering medicine (statin). Find healthy ways to manage stress, such as: Meditation, yoga, or listening to music. Journaling. Talking to a trusted person. Spending time with friends and family. Minimize exposure to UV radiation to reduce your risk of skin cancer. Safety Always wear your seat belt while driving or riding in a vehicle. Do not drive: If you have been drinking alcohol. Do not ride with someone who has been drinking. When you are tired or distracted. While texting. If you have been using any mind-altering substances or drugs. Wear a helmet and other protective equipment during sports activities. If you have firearms in your house, make sure you follow all gun safety procedures. What's next? Visit your health care provider once a year for an annual wellness visit. Ask your health care provider how often you should have your eyes and teeth checked. Stay up to date on all vaccines. This information is not  intended to replace advice given to you by your health care provider. Make sure you discuss any questions you have with your health care provider. Document Revised: 03/08/2021 Document Reviewed: 03/08/2021 Elsevier Patient Education  [place].
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
## 2021-10-31 DIAGNOSIS — R531 Weakness: Secondary | ICD-10-CM | POA: Diagnosis not present

## 2021-10-31 DIAGNOSIS — M84371S Stress fracture, right ankle, sequela: Secondary | ICD-10-CM | POA: Diagnosis not present

## 2021-10-31 DIAGNOSIS — R269 Unspecified abnormalities of gait and mobility: Secondary | ICD-10-CM | POA: Diagnosis not present

## 2021-10-31 DIAGNOSIS — S93491D Sprain of other ligament of right ankle, subsequent encounter: Secondary | ICD-10-CM | POA: Diagnosis not present

## 2021-11-01 ENCOUNTER — Other Ambulatory Visit: Payer: Self-pay | Admitting: Family Medicine

## 2021-11-01 DIAGNOSIS — Z1231 Encounter for screening mammogram for malignant neoplasm of breast: Secondary | ICD-10-CM

## 2021-11-06 DIAGNOSIS — S93491D Sprain of other ligament of right ankle, subsequent encounter: Secondary | ICD-10-CM | POA: Diagnosis not present

## 2021-11-06 DIAGNOSIS — R531 Weakness: Secondary | ICD-10-CM | POA: Diagnosis not present

## 2021-11-06 DIAGNOSIS — M84371S Stress fracture, right ankle, sequela: Secondary | ICD-10-CM | POA: Diagnosis not present

## 2021-11-06 DIAGNOSIS — R269 Unspecified abnormalities of gait and mobility: Secondary | ICD-10-CM | POA: Diagnosis not present

## 2021-11-10 ENCOUNTER — Ambulatory Visit
Admission: RE | Admit: 2021-11-10 | Discharge: 2021-11-10 | Disposition: A | Discharge status: Home / Self Care | Payer: Medicare HMO | Source: Ambulatory Visit | Attending: Family Medicine | Admitting: Family Medicine

## 2021-11-10 DIAGNOSIS — Z1231 Encounter for screening mammogram for malignant neoplasm of breast: Secondary | ICD-10-CM

## 2021-11-13 DIAGNOSIS — R531 Weakness: Secondary | ICD-10-CM | POA: Diagnosis not present

## 2021-11-13 DIAGNOSIS — M84371S Stress fracture, right ankle, sequela: Secondary | ICD-10-CM | POA: Diagnosis not present

## 2021-11-13 DIAGNOSIS — R269 Unspecified abnormalities of gait and mobility: Secondary | ICD-10-CM | POA: Diagnosis not present

## 2021-11-13 DIAGNOSIS — S93491D Sprain of other ligament of right ankle, subsequent encounter: Secondary | ICD-10-CM | POA: Diagnosis not present

## 2021-11-20 DIAGNOSIS — M84371S Stress fracture, right ankle, sequela: Secondary | ICD-10-CM | POA: Diagnosis not present

## 2021-11-20 DIAGNOSIS — R531 Weakness: Secondary | ICD-10-CM | POA: Diagnosis not present

## 2021-11-20 DIAGNOSIS — S93491D Sprain of other ligament of right ankle, subsequent encounter: Secondary | ICD-10-CM | POA: Diagnosis not present

## 2021-11-20 DIAGNOSIS — R269 Unspecified abnormalities of gait and mobility: Secondary | ICD-10-CM | POA: Diagnosis not present

## 2021-11-27 DIAGNOSIS — M84371S Stress fracture, right ankle, sequela: Secondary | ICD-10-CM | POA: Diagnosis not present

## 2021-11-27 DIAGNOSIS — S93491D Sprain of other ligament of right ankle, subsequent encounter: Secondary | ICD-10-CM | POA: Diagnosis not present

## 2021-11-27 DIAGNOSIS — R269 Unspecified abnormalities of gait and mobility: Secondary | ICD-10-CM | POA: Diagnosis not present

## 2021-11-27 DIAGNOSIS — R531 Weakness: Secondary | ICD-10-CM | POA: Diagnosis not present

## 2022-01-16 ENCOUNTER — Encounter: Payer: Self-pay | Admitting: Family Medicine

## 2022-02-05 ENCOUNTER — Ambulatory Visit (INDEPENDENT_AMBULATORY_CARE_PROVIDER_SITE_OTHER): Payer: Medicare HMO | Admitting: Family Medicine

## 2022-02-05 VITALS — BP 124/76 | HR 76 | Temp 98.1°F | Resp 16 | Ht 61.0 in | Wt 171.6 lb

## 2022-02-05 DIAGNOSIS — R1310 Dysphagia, unspecified: Secondary | ICD-10-CM | POA: Diagnosis not present

## 2022-02-05 DIAGNOSIS — R052 Subacute cough: Secondary | ICD-10-CM

## 2022-02-05 DIAGNOSIS — Z800 Family history of malignant neoplasm of digestive organs: Secondary | ICD-10-CM

## 2022-02-05 NOTE — Patient Instructions (Addendum)
Zyrtec once per day for possible allergies, zantac or pepcid twice per day for possible heartburn/reflux cause. Keep me posted on symptoms in next few weeks, and appointment in 6 weeks. ? ?I will refer you to ENT to discuss cough, family history of throat cancer and prior issues with food getting stuck. We have the option of gastroenterology referral next if needed.  ? ?Return to the clinic or go to the nearest emergency room if any of your symptoms worsen or new symptoms occur. ? ?

## 2022-02-05 NOTE — Progress Notes (Signed)
? ?Subjective:  ?Patient ID: Andrea Morrow, female    DOB: Jan 24, 1951  Age: 71 y.o. MRN: [id number] ? ?CC:  ?Chief Complaint  ?Patient presents with  ? Cough  ?  Pt reports cough starting several months ago, pt reports productive cough, notes has been intermittent at some points, pt reports also having some trouble swallowing a few times on bread, brother has history of throat cancer so she is concerned about these sxs   ? ? ?HPI ?Andrea Morrow presents for  ? ?Cough: ?Past few months. No recent change. She hasn't noticed it - others notice it.  ?Tx: none  ?No ACE-I, no allergies typically.  ?Dry cough, during day. Not waking up at night.  ?No weight loss, night sweats or fevers.  ?No pnd or nasal congestion.  ?No chest congestion/wheezing/dyspnea or chest pain.  ?No heartburn. Few times in past few years - sensation of food getting stuck, sore then eventually swallows. 5-6 times in past few years, no change in frequency.  ?No globus sensation typically.  ?Brother passed with throat cancer. Smoker, and drank alcohol.  ?Colonoscopy 2019 - repeat 25yr, hyperplastic polyp.  ? ?History ?Patient Active Problem List  ? Diagnosis Date Noted  ? Hyperlipidemia 03/30/2016  ? Essential hypertension 03/30/2016  ? Other screening mammogram 01/28/2014  ? ?Past Medical History:  ?Diagnosis Date  ? Hyperlipidemia   ? Hypertension   ? ?Past Surgical History:  ?Procedure Laterality Date  ? TUBAL LIGATION    ? ?Allergies  ?Allergen Reactions  ? Poison Ivy Extract Rash  ? ?Prior to Admission medications   ?Medication Sig Start Date End Date Taking? Authorizing Provider  ?Ascorbic Acid (VITAMIN C) 1000 MG tablet Take 1,000 mg by mouth daily.   Yes [provider]  ?cholecalciferol (VITAMIN D3) 25 MCG (1000 UNIT) tablet Take 1,000 Units by mouth daily.   Yes [provider]  ?Diclofenac Sodium CR 100 MG 24 hr tablet Take 1 tablet (100 mg total) by mouth daily. 04/16/21  Yes Palumbo, April, MD  ?Ferrous Gluconate-C-Folic  Acid (IRON-C PO) Take by mouth.   Yes [provider]  ?hydrochlorothiazide (MICROZIDE) 12.5 MG capsule Take 1 capsule (12.5 mg total) by mouth daily. 10/26/21  Yes GWendie Agreste MD  ?Multiple Vitamins-Calcium (ONE-A-DAY WOMENS PO) Take by mouth.   Yes [provider]  ?rosuvastatin (CRESTOR) 10 MG tablet Take 1 tablet (10 mg total) by mouth daily. 10/26/21  Yes GWendie Agreste MD  ? ?Social History  ? ?Socioeconomic History  ? Marital status: Married  ?  Spouse name: Not on file  ? Number of children: Not on file  ? Years of education: Not on file  ? Highest education level: Not on file  ?Occupational History  ? Not on file  ?Tobacco Use  ? Smoking status: Never  ? Smokeless tobacco: Never  ?Vaping Use  ? Vaping Use: Never used  ?Substance and Sexual Activity  ? Alcohol use: No  ? Drug use: No  ? Sexual activity: Not Currently  ?Other Topics Concern  ? Not on file  ?Social History Narrative  ? Married  ? 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 ?Social Determinants of Health  ? ?Financial Resource Strain: Not on file  ?Food Insecurity: Not on file  ?Transportation Needs: Not on file  ?Physical Activity: Not on file  ?Stress: Not on file  ?Social Connections: Not on file  ?Intimate Partner Violence: Not on file  ? ? ?Review of Systems ? ? ?  Objective:  ? ?Vitals:  ? 02/05/22 1321  ?BP: 124/76  ?Pulse: 76  ?Resp: 16  ?Temp: 98.1 ?F (36.7 ?C)  ?TempSrc: Temporal  ?SpO2: 97%  ?Weight: 171 lb 9.6 oz (77.8 kg)  ?Height: '5\' 1"'$  (1.549 m)  ? ? ? ?Physical Exam ?Vitals reviewed.  ?Constitutional:   ?   General: She is not in acute distress. ?   Appearance: She is well-developed.  ?HENT:  ?   Head: Normocephalic and atraumatic.  ?   Right Ear: Hearing, tympanic membrane, ear canal and external ear normal.  ?   Left Ear: Hearing, tympanic membrane, ear canal and external ear normal.  ?   Nose: Nose normal.  ?   Mouth/Throat:  ?   Mouth: Mucous membranes are moist.  ?   Pharynx: No oropharyngeal exudate or posterior  oropharyngeal erythema.  ?Eyes:  ?   Conjunctiva/sclera: Conjunctivae normal.  ?   Pupils: Pupils are equal, round, and reactive to light.  ?Cardiovascular:  ?   Rate and Rhythm: Normal rate and regular rhythm.  ?   Heart sounds: Normal heart sounds. No murmur heard. ?Pulmonary:  ?   Effort: Pulmonary effort is normal. No respiratory distress.  ?   Breath sounds: Normal breath sounds. No stridor. No wheezing or rhonchi.  ?Musculoskeletal:  ?   Cervical back: No tenderness.  ?Lymphadenopathy:  ?   Cervical: No cervical adenopathy.  ?Skin: ?   General: Skin is warm and dry.  ?   Findings: No rash.  ?Neurological:  ?   Mental Status: She is alert and oriented to person, place, and time.  ?Psychiatric:     ?   Mood and Affect: Mood normal.     ?   Behavior: Behavior normal.  ? ? ? ? ? ?Assessment & Plan:  ?[name] is a 71 y.o. female . ?Subacute cough - Plan: Ambulatory referral to ENT ? ?Family history of throat cancer - Plan: Ambulatory referral to ENT ? ?Dysphagia, unspecified type - Plan: Ambulatory referral to ENT ? ?Possible upper airway cough syndrome with postnasal drip or silent reflux as potential causes, especially with rare dysphagia.  Family history of throat cancer in brother.  Start Zyrtec daily, and Zantac or Pepcid twice daily to treat potential allergies or reflux because initially, refer to ENT for evaluation and possible endoscopy, consider GI eval for possible endoscopy if persistent dysphagia.  Recheck 6 weeks with RTC precautions if worse sooner. ? ?No orders of the defined types were placed in this encounter. ? ?Patient Instructions  ?Zyrtec once per day for possible allergies, zantac or pepcid twice per day for possible heartburn/reflux cause. Keep me posted on symptoms in next few weeks, and appointment in 6 weeks. ? ?I will refer you to ENT to discuss cough, family history of throat cancer and prior issues with food getting stuck. We have the option of gastroenterology referral next if  needed.  ? ?Return to the clinic or go to the nearest emergency room if any of your symptoms worsen or new symptoms occur. ? ? ? ? ?Signed,  ? ?[name], MD ?[place], [facility] ?[facility] ?02/06/22 ?8:21 AM ? ? ?

## 2022-02-06 ENCOUNTER — Encounter: Payer: Self-pay | Admitting: Family Medicine

## 2022-02-13 NOTE — Telephone Encounter (Signed)
Note I see on referral states that you were waiting on office notes to be closed, was this sent? If this has been sent can you tell me what office so I can provide this information to the patient for contact?  Thank you

## 2022-02-14 NOTE — Telephone Encounter (Signed)
Pt notes cannot get in sooner than July

## 2022-02-16 NOTE — Telephone Encounter (Signed)
Pt unsure if allergy medication is effective should she try anything else or should return to clinic for review of sxs

## 2022-02-21 NOTE — Telephone Encounter (Addendum)
Pt reports back trying zrytec again today

## 2022-03-18 ENCOUNTER — Ambulatory Visit
Admission: RE | Admit: 2022-03-18 | Discharge: 2022-03-18 | Disposition: A | Discharge status: Home / Self Care | Payer: Medicare HMO | Source: Ambulatory Visit | Attending: Physician Assistant | Admitting: Physician Assistant

## 2022-03-18 VITALS — BP 158/84 | HR 78 | Temp 98.0°F | Resp 18

## 2022-03-18 DIAGNOSIS — H109 Unspecified conjunctivitis: Secondary | ICD-10-CM

## 2022-03-18 MED ORDER — TOBRAMYCIN 0.3 % OP SOLN: 2.0000 [drp] | OPHTHALMIC | 0 refills | Status: DC

## 2022-03-19 ENCOUNTER — Ambulatory Visit: Payer: Medicare HMO | Admitting: Family Medicine

## 2022-06-08 ENCOUNTER — Ambulatory Visit
Admission: EM | Admit: 2022-06-08 | Discharge: 2022-06-08 | Disposition: A | Discharge status: Home / Self Care | Payer: Medicare HMO

## 2022-06-08 ENCOUNTER — Other Ambulatory Visit: Payer: Self-pay

## 2022-06-08 ENCOUNTER — Emergency Department (HOSPITAL_COMMUNITY)
Admission: EM | Admit: 2022-06-08 | Discharge: 2022-06-09 | Disposition: A | Discharge status: Home / Self Care | Payer: Medicare HMO | Attending: Emergency Medicine | Admitting: Emergency Medicine

## 2022-06-08 ENCOUNTER — Ambulatory Visit (INDEPENDENT_AMBULATORY_CARE_PROVIDER_SITE_OTHER): Payer: Medicare HMO | Admitting: Family Medicine

## 2022-06-08 ENCOUNTER — Encounter (HOSPITAL_COMMUNITY): Payer: Self-pay

## 2022-06-08 ENCOUNTER — Encounter: Payer: Self-pay | Admitting: Physician Assistant

## 2022-06-08 DIAGNOSIS — Z743 Need for continuous supervision: Secondary | ICD-10-CM | POA: Diagnosis not present

## 2022-06-08 DIAGNOSIS — T7840XA Allergy, unspecified, initial encounter: Secondary | ICD-10-CM | POA: Insufficient documentation

## 2022-06-08 DIAGNOSIS — T63441A Toxic effect of venom of bees, accidental (unintentional), initial encounter: Secondary | ICD-10-CM

## 2022-06-08 DIAGNOSIS — L2489 Irritant contact dermatitis due to other agents: Secondary | ICD-10-CM | POA: Diagnosis not present

## 2022-06-08 DIAGNOSIS — Z23 Encounter for immunization: Secondary | ICD-10-CM | POA: Diagnosis not present

## 2022-06-08 DIAGNOSIS — R21 Rash and other nonspecific skin eruption: Secondary | ICD-10-CM | POA: Diagnosis present

## 2022-06-08 DIAGNOSIS — I10 Essential (primary) hypertension: Secondary | ICD-10-CM | POA: Diagnosis not present

## 2022-06-08 DIAGNOSIS — T63451A Toxic effect of venom of hornets, accidental (unintentional), initial encounter: Secondary | ICD-10-CM | POA: Diagnosis not present

## 2022-06-08 DIAGNOSIS — L299 Pruritus, unspecified: Secondary | ICD-10-CM | POA: Diagnosis not present

## 2022-06-08 MED ORDER — FAMOTIDINE 20 MG PO TABS
20.0000 mg | ORAL_TABLET | Freq: Once | ORAL | Status: AC
  Administered 2022-06-08: 20 mg via ORAL
  Filled 2022-06-08: qty 1

## 2022-06-08 MED ORDER — METHYLPREDNISOLONE SODIUM SUCC 125 MG IJ SOLR
125.0000 mg | Freq: Once | INTRAMUSCULAR | Status: AC
  Administered 2022-06-08: 125 mg via INTRAVENOUS
  Filled 2022-06-08: qty 2

## 2022-06-08 MED ORDER — KETOROLAC TROMETHAMINE 15 MG/ML IJ SOLN
15.0000 mg | Freq: Once | INTRAMUSCULAR | Status: AC
  Administered 2022-06-09: 15 mg via INTRAVENOUS
  Filled 2022-06-08: qty 1

## 2022-06-08 MED ORDER — DIPHENHYDRAMINE HCL 50 MG/ML IJ SOLN
25.0000 mg | Freq: Once | INTRAMUSCULAR | Status: AC
  Administered 2022-06-09: 25 mg via INTRAVENOUS
  Filled 2022-06-08: qty 1

## 2022-06-08 MED ORDER — SODIUM CHLORIDE 0.9 % IV BOLUS
1000.0000 mL | Freq: Once | INTRAVENOUS | Status: AC
  Administered 2022-06-09: 1000 mL via INTRAVENOUS

## 2022-06-08 NOTE — ED Provider Notes (Signed)
[facility] DEPT Provider Note   CSN: [id number] Arrival date & time: 06/08/22  2040     History {Add pertinent medical, surgical, social history, OB history to HPI:1} Chief Complaint  Patient presents with   Allergic Reaction    Andrea Morrow is a 71 y.o. female, no pertinent past medical history, who presents to the ED secondary to full body rash, that occurred after being bit by yellow jackets at 7 PM tonight.  She states that she has been on her legs several times, she has mild tenderness to the left calf, and itching all over.  Took 25 mg Benadryl at home, was given 50 mg Benadryl by EMS, and then Solu-Medrol 125 mg and famotidine 20 mg in the waiting room.  She states that her symptoms are better but she still is a little bit tender behind her leg, and itchy on her right thigh and abdomen.  Went to urgent care initially and was told to come to the ER. She denies any SOB, chest pain, throat scratchiness, tongue swelling. She notes when this originally happened she was very anxious but that has since resolved.   Allergic Reaction Presenting symptoms: rash        Home Medications Prior to Admission medications   Medication Sig Start Date End Date Taking? Authorizing Provider  Ascorbic Acid (VITAMIN C) 1000 MG tablet Take 1,000 mg by mouth daily.    [provider]  cholecalciferol (VITAMIN D3) 25 MCG (1000 UNIT) tablet Take 1,000 Units by mouth daily.    [provider]  Diclofenac Sodium CR 100 MG 24 hr tablet Take 1 tablet (100 mg total) by mouth daily. 04/16/21   Palumbo, April, MD  Ferrous Gluconate-C-Folic Acid (IRON-C PO) Take by mouth.    [provider]  hydrochlorothiazide (MICROZIDE) 12.5 MG capsule Take 1 capsule (12.5 mg total) by mouth daily. 10/26/21   [name], MD  Multiple Vitamins-Calcium (ONE-A-DAY WOMENS PO) Take by mouth.    [provider]  rosuvastatin (CRESTOR) 10 MG tablet Take 1 tablet  (10 mg total) by mouth daily. 10/26/21   [name], MD  tobramycin (TOBREX) 0.3 % ophthalmic solution Place 2 drops into the right eye every 4 (four) hours. 03/18/22   Ward, Lenise Arena, PA-C      Allergies    Poison ivy extract    Review of Systems   Review of Systems  Skin:  Positive for rash.    Physical Exam Updated Vital Signs BP (!) 164/100   Pulse (!) 108   Temp 97.8 F (36.6 C) (Oral)   Resp 20   Ht '5\' 1"'$  (1.549 m)   Wt 77.8 kg   SpO2 100%   BMI 32.41 kg/m  Physical Exam Vitals and nursing note reviewed.  Constitutional:      Appearance: Normal appearance.  HENT:     Head: Normocephalic and atraumatic.     Right Ear: Tympanic membrane normal.     Left Ear: Tympanic membrane normal.     Nose: Nose normal.     Mouth/Throat:     Mouth: Mucous membranes are moist.  Eyes:     Extraocular Movements: Extraocular movements intact.     Conjunctiva/sclera: Conjunctivae normal.     Pupils: Pupils are equal, round, and reactive to light.  Cardiovascular:     Rate and Rhythm: Normal rate and regular rhythm.  Pulmonary:     Effort: Pulmonary effort is normal.     Breath  sounds: Normal breath sounds.  Abdominal:     General: Abdomen is flat. Bowel sounds are normal.     Palpations: Abdomen is soft.  Musculoskeletal:        General: Normal range of motion.     Cervical back: Normal range of motion and neck supple.  Skin:    General: Skin is warm and dry.     Capillary Refill: Capillary refill takes less than 2 seconds.          Comments: +urticaria   Neurological:     General: No focal deficit present.     Mental Status: She is alert.  Psychiatric:        Mood and Affect: Mood normal.        Thought Content: Thought content normal.     ED Results / Procedures / Treatments   Labs (all labs ordered are listed, but only abnormal results are displayed) Labs Reviewed - No data to display  EKG None  Radiology No results found.  Procedures Procedures   {Document cardiac monitor, telemetry assessment procedure when appropriate:1}  Medications Ordered in ED Medications  methylPREDNISolone sodium succinate (SOLU-MEDROL) 125 mg/2 mL injection 125 mg (125 mg Intravenous Given 06/08/22 2100)  famotidine (PEPCID) tablet 20 mg (20 mg Oral Given 06/08/22 2100)    ED Course/ Medical Decision Making/ A&P                           Medical Decision Making Risk Prescription drug management.   ***  {Document critical care time when appropriate:1} {Document review of labs and clinical decision tools ie heart score, Chads2Vasc2 etc:1}  {Document your independent review of radiology images, and any outside records:1} {Document your discussion with family members, caretakers, and with consultants:1} {Document social determinants of health affecting pt's care:1} {Document your decision making why or why not admission, treatments were needed:1} Final Clinical Impression(s) / ED Diagnoses Final diagnoses:  None    Rx / DC Orders ED Discharge Orders     None

## 2022-06-08 NOTE — ED Triage Notes (Signed)
BIB [facility] with c/o allergic reaction. States she was stung by 4-5 bees earlier today. Flushed on EMS arrival. 25 mg benadryl PTA EMS arrival. Given 50 mg IV benadryl by EMS. No SOB, no N/V.

## 2022-06-08 NOTE — Progress Notes (Signed)
Patient was in today with husband and requested her flu vaccine today added today

## 2022-06-08 NOTE — ED Provider Notes (Signed)
[facility]    CSN: [id number] Arrival date & time: 06/08/22  1936      History   Chief Complaint No chief complaint on file.   HPI Andrea Morrow is a 71 y.o. female.   Patient here today for evaluation of multiple bee stings. She reports she was moving a flower pot about 45 minutes before coming into the office today. She does not have history of bee venom allergy. She reports she got her flu shot today and is not sure if this has exacerbated symptoms. Per husband patient took shower and benadryl and is still overtly anxious- hyperventilating. He brought her in for evaluation when he was unable to calm her down. She also notes rash all over and itching. Does not seem to have trouble breathing otherwise. She has not had had any trouble swallowing.   The history is provided by the patient and the spouse.    Past Medical History:  Diagnosis Date   Hyperlipidemia    Hypertension     Patient Active Problem List   Diagnosis Date Noted   Hyperlipidemia 03/30/2016   Essential hypertension 03/30/2016   Other screening mammogram 01/28/2014    Past Surgical History:  Procedure Laterality Date   TUBAL LIGATION      OB History   No obstetric history on file.      Home Medications    Prior to Admission medications   Medication Sig Start Date End Date Taking? Authorizing Provider  Ascorbic Acid (VITAMIN C) 1000 MG tablet Take 1,000 mg by mouth daily.    [provider]  cholecalciferol (VITAMIN D3) 25 MCG (1000 UNIT) tablet Take 1,000 Units by mouth daily.    [provider]  Diclofenac Sodium CR 100 MG 24 hr tablet Take 1 tablet (100 mg total) by mouth daily. 04/16/21   Palumbo, April, MD  Ferrous Gluconate-C-Folic Acid (IRON-C PO) Take by mouth.    [provider]  hydrochlorothiazide (MICROZIDE) 12.5 MG capsule Take 1 capsule (12.5 mg total) by mouth daily. 10/26/21   [name], MD  Multiple Vitamins-Calcium (ONE-A-DAY WOMENS  PO) Take by mouth.    [provider]  rosuvastatin (CRESTOR) 10 MG tablet Take 1 tablet (10 mg total) by mouth daily. 10/26/21   [name], MD  tobramycin (TOBREX) 0.3 % ophthalmic solution Place 2 drops into the right eye every 4 (four) hours. 03/18/22   Ward, Lenise Arena, PA-C    Family History Family History  Problem Relation Age of Onset   Cancer Mother    Hyperlipidemia Mother    Heart disease Brother    Colon cancer Neg Hx    Esophageal cancer Neg Hx    Pancreatic cancer Neg Hx    Rectal cancer Neg Hx    Stomach cancer Neg Hx     Social History Social History   Tobacco Use   Smoking status: Never   Smokeless tobacco: Never  Vaping Use   Vaping Use: Never used  Substance Use Topics   Alcohol use: No   Drug use: No     Allergies   Poison ivy extract   Review of Systems Review of Systems  Constitutional:  Negative for chills and fever.  HENT:  Negative for trouble swallowing.   Eyes:  Negative for discharge and redness.  Respiratory:  Positive for shortness of breath.   Gastrointestinal:  Negative for nausea and vomiting.  Skin:  Positive for color change and rash.  Psychiatric/Behavioral:  The  patient is nervous/anxious.      Physical Exam Triage Vital Signs ED Triage Vitals  Enc Vitals Group     BP      Pulse      Resp      Temp      Temp src      SpO2      Weight      Height      Head Circumference      Peak Flow      Pain Score      Pain Loc      Pain Edu?      Excl. in [place]?    No data found.  Updated Vital Signs BP 123/89   Pulse (!) 113   Temp (!) 96.8 F (36 C)   Resp 19   SpO2 98%      Physical Exam Vitals and nursing note reviewed.  Constitutional:      Comments: Appears anxious, hyperventilating, shaking, scratching, wringing hands  HENT:     Head: Normocephalic and atraumatic.  Eyes:     Conjunctiva/sclera: Conjunctivae normal.  Cardiovascular:     Rate and Rhythm: Tachycardia present.  Pulmonary:      Effort: Pulmonary effort is normal. No respiratory distress.     Comments: Increased respiratory rate.  Skin:    Comments: Multiple hives noted to inner thighs  Neurological:     Mental Status: She is alert.  Psychiatric:        Mood and Affect: Mood normal.        Behavior: Behavior normal.        Thought Content: Thought content normal.      UC Treatments / Results  Labs (all labs ordered are listed, but only abnormal results are displayed) Labs Reviewed - No data to display  EKG   Radiology No results found.  Procedures Procedures (including critical care time)  Medications Ordered in UC Medications - No data to display  Initial Impression / Assessment and Plan / UC Course  I have reviewed the triage vital signs and the nursing notes.  Pertinent labs & imaging results that were available during my care of the patient were reviewed by me and considered in my medical decision making (see chart for details).    Given tachycardia, hyperventilation recommended further evaluation by EMS-- patient does not appear to be having anaphylactic reaction but more apparently having acute panic attack which we are not equipped to treat in urgent care.   Final Clinical Impressions(s) / UC Diagnoses   Final diagnoses:  Bee sting, accidental or unintentional, initial encounter   Discharge Instructions   None    ED Prescriptions   None    PDMP not reviewed this encounter.   Francene Finders, PA-C 06/08/22 2003

## 2022-06-08 NOTE — ED Triage Notes (Signed)
Pt presents to uc with co of bee stings from 45 minutes ago. Pt was outside moving a flower pot when she was swarmed. Pt reports she took a shower and benadryl. Pt is concerned bc she got flu shot today.

## 2022-06-08 NOTE — ED Notes (Signed)
Patient is being discharged from the Urgent Care and sent to the Emergency Department via ems . Per myers, pa, patient is in need of higher level of care due to symptoms and tachycardia. Patient is aware and verbalizes understanding of plan of care.  Vitals:   06/08/22 1954 06/08/22 2001  BP:    Pulse: (!) 121 (!) 113  Resp:    Temp:    SpO2:

## 2022-06-08 NOTE — ED Provider Triage Note (Signed)
Emergency Medicine Provider Triage Evaluation Note  Andrea Morrow , a 71 y.o. female  was evaluated in triage.  Pt complains of being bit by yellow jackets at 7 PM tonight.  Denies throat swelling, shortness of breath, wheezing.  Does have generalized urticarial rash.  She is without nausea.  Patient took p.o. 25 mg Benadryl at home.  She was given additional 50 mg via EMS in route.  Review of Systems  Positive: As above Negative: As above  Physical Exam  BP (!) 164/100   Pulse (!) 108   Temp 97.8 F (36.6 C) (Oral)   Resp 20   Ht '5\' 1"'$  (1.549 m)   Wt 77.8 kg   SpO2 100%   BMI 32.41 kg/m  Gen:   Awake, no distress   Resp:  Normal effort  MSK:   Moves extremities without difficulty  Other:  Airways patent.  No wheezing on auscultation.  Generalized urticarial rash noted.  Medical Decision Making  Medically screening exam initiated at 8:56 PM.  Appropriate orders placed.  Andrea Morrow was informed that the remainder of the evaluation will be completed by another provider, this initial triage assessment does not replace that evaluation, and the importance of remaining in the ED until their evaluation is complete.  We will provide Solu-Medrol 125 mg.  Will provide Pepcid.  No indication for epinephrine at this point.  Discussed with patient the concerning symptoms to notify us of while she is waiting for room and would warrant epinephrine use.    Evlyn Courier, PA-C 06/08/22 2058

## 2022-06-09 MED ORDER — ACETAMINOPHEN 325 MG PO TABS
650.0000 mg | ORAL_TABLET | Freq: Once | ORAL | Status: AC
  Administered 2022-06-09: 650 mg via ORAL
  Filled 2022-06-09: qty 2

## 2022-06-09 MED ORDER — LIDOCAINE 5 % EX PTCH: 1.0000 | MEDICATED_PATCH | CUTANEOUS | 0 refills | Status: DC

## 2022-06-09 MED ORDER — PREDNISONE 10 MG PO TABS: ORAL_TABLET | ORAL | 0 refills | Status: AC

## 2022-06-09 NOTE — ED Notes (Signed)
Pt reported burning as soon as toradol started going into IV. Writer stopped, and flushed immediately. Burning resolved. No signs of infiltration. Long, MD notified.

## 2022-06-09 NOTE — ED Notes (Signed)
Andrea Small, PA okay with patient discharging with fluids being stopped now and not getting full 1019m Normal Saline.

## 2022-06-09 NOTE — Discharge Instructions (Addendum)
Please follow-up with PCP if needed. Return to ED if you have difficulty breathing, swelling of the throat or mouth.

## 2022-06-11 ENCOUNTER — Encounter: Payer: Self-pay | Admitting: Family Medicine

## 2022-06-13 NOTE — Telephone Encounter (Signed)
Pt notes this was FYI and he feels "back to normal"   Also notes he does have surgery scheduled for removal of concerning bump on the patients behind.

## 2022-07-31 DIAGNOSIS — K219 Gastro-esophageal reflux disease without esophagitis: Secondary | ICD-10-CM | POA: Insufficient documentation

## 2022-09-27 ENCOUNTER — Other Ambulatory Visit: Payer: Self-pay | Admitting: Family Medicine

## 2022-09-27 DIAGNOSIS — Z1231 Encounter for screening mammogram for malignant neoplasm of breast: Secondary | ICD-10-CM
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
## 2022-10-29 ENCOUNTER — Ambulatory Visit (INDEPENDENT_AMBULATORY_CARE_PROVIDER_SITE_OTHER): Payer: Medicare HMO | Admitting: Family Medicine

## 2022-10-29 ENCOUNTER — Encounter: Payer: Self-pay | Admitting: Family Medicine

## 2022-10-29 VITALS — BP 126/78 | HR 77 | Temp 97.8°F | Ht 61.5 in | Wt 170.4 lb

## 2022-10-29 DIAGNOSIS — I10 Essential (primary) hypertension: Secondary | ICD-10-CM

## 2022-10-29 DIAGNOSIS — E2839 Other primary ovarian failure: Secondary | ICD-10-CM

## 2022-10-29 DIAGNOSIS — Z0000 Encounter for general adult medical examination without abnormal findings: Secondary | ICD-10-CM

## 2022-10-29 DIAGNOSIS — E785 Hyperlipidemia, unspecified: Secondary | ICD-10-CM

## 2022-10-29 DIAGNOSIS — R7303 Prediabetes: Secondary | ICD-10-CM

## 2022-10-29 LAB — COMPREHENSIVE METABOLIC PANEL
ALT: 18 U/L (ref 0–35)
AST: 22 U/L (ref 0–37)
Albumin: 4.3 g/dL (ref 3.5–5.2)
Alkaline Phosphatase: 64 U/L (ref 39–117)
BUN: 16 mg/dL (ref 6–23)
CO2: 29 mEq/L (ref 19–32)
Calcium: 9.8 mg/dL (ref 8.4–10.5)
Chloride: 104 mEq/L (ref 96–112)
Creatinine, Ser: 0.87 mg/dL (ref 0.40–1.20)
GFR: 67.65 mL/min (ref >60.00)
Glucose, Bld: 100 mg/dL — ABNORMAL HIGH (ref 70–99)
Potassium: 4.2 mEq/L (ref 3.5–5.1)
Sodium: 139 mEq/L (ref 135–145)
Total Bilirubin: 0.6 mg/dL (ref 0.2–1.2)
Total Protein: 7 g/dL (ref 6.0–8.3)

## 2022-10-29 LAB — LIPID PANEL
Cholesterol: 168 mg/dL (ref 0–200)
HDL: 66.4 mg/dL (ref >39.00)
LDL Cholesterol: 83 mg/dL (ref 0–99)
NonHDL: 101.77
Total CHOL/HDL Ratio: 3
Triglycerides: 93 mg/dL (ref 0.0–149.0)
VLDL: 18.6 mg/dL (ref 0.0–40.0)

## 2022-10-29 LAB — HEMOGLOBIN A1C: Hgb A1c MFr Bld: 6.4 % (ref 4.6–6.5)

## 2022-10-29 MED ORDER — ROSUVASTATIN CALCIUM 10 MG PO TABS: 10.0000 mg | ORAL_TABLET | Freq: Every day | ORAL | 3 refills | Status: DC

## 2022-10-29 MED ORDER — HYDROCHLOROTHIAZIDE 12.5 MG PO CAPS: 12.5000 mg | ORAL_CAPSULE | Freq: Every day | ORAL | 3 refills | Status: DC

## 2022-10-29 NOTE — Patient Instructions (Signed)
Thanks for coming today.  If any concerns on labs I will let you know.  68-monthfollow-up for prediabetes and we can check him on blood pressure and cholesterol at that time.  I did place an order for the bone density test to be scheduled after June as that will be 2 years.  Let me know if there are questions and take care.   Preventive Care 679Years and Older, Female Preventive care refers to lifestyle choices and visits with your health care provider that can promote health and wellness. Preventive care visits are also called wellness exams. What can I expect for my preventive care visit? Counseling Your health care provider may ask you questions about your: Medical history, including: Past medical problems. Family medical history. Pregnancy and menstrual history. History of falls. Current health, including: Memory and ability to understand (cognition). Emotional well-being. Home life and relationship well-being. Sexual activity and sexual health. Lifestyle, including: Alcohol, nicotine or tobacco, and drug use. Access to firearms. Diet, exercise, and sleep habits. Work and work [employer] use. Safety issues such as seatbelt and bike helmet use. Physical exam Your health care provider will check your: Height and weight. These may be used to calculate your BMI (body mass index). BMI is a measurement that tells if you are at a healthy weight. Waist circumference. This measures the distance around your waistline. This measurement also tells if you are at a healthy weight and may help predict your risk of certain diseases, such as type 2 diabetes and high blood pressure. Heart rate and blood pressure. Body temperature. Skin for abnormal spots. What immunizations do I need?  Vaccines are usually given at various ages, according to a schedule. Your health care provider will recommend vaccines for you based on your age, medical history, and lifestyle or other factors, such as  travel or where you work. What tests do I need? Screening Your health care provider may recommend screening tests for certain conditions. This may include: Lipid and cholesterol levels. Hepatitis C test. Hepatitis B test. HIV (human immunodeficiency virus) test. STI (sexually transmitted infection) testing, if you are at risk. Lung cancer screening. Colorectal cancer screening. Diabetes screening. This is done by checking your blood sugar (glucose) after you have not eaten for a while (fasting). Mammogram. Talk with your health care provider about how often you should have regular mammograms. BRCA-related cancer screening. This may be done if you have a family history of breast, ovarian, tubal, or peritoneal cancers. Bone density scan. This is done to screen for osteoporosis. Talk with your health care provider about your test results, treatment options, and if necessary, the need for more tests. Follow these instructions at home: Eating and drinking  Eat a diet that includes fresh fruits and vegetables, whole grains, lean protein, and low-fat dairy products. Limit your intake of foods with high amounts of sugar, saturated fats, and salt. Take vitamin and mineral supplements as recommended by your health care provider. Do not drink alcohol if your health care provider tells you not to drink. If you drink alcohol: Limit how much you have to 0-1 drink a day. Know how much alcohol is in your drink. In the [place] one drink equals one 12 oz bottle of beer (355 mL), one 5 oz glass of wine (148 mL), or one 1 oz glass of hard liquor (44 mL). Lifestyle Brush your teeth every morning and night with fluoride toothpaste. Floss one time each day. Exercise for at least 30 minutes 5  or more days each week. Do not use any products that contain nicotine or tobacco. These products include cigarettes, chewing tobacco, and vaping devices, such as e-cigarettes. If you need help quitting, ask your health care  provider. Do not use drugs. If you are sexually active, practice safe sex. Use a condom or other form of protection in order to prevent STIs. Take aspirin only as told by your health care provider. Make sure that you understand how much to take and what form to take. Work with your health care provider to find out whether it is safe and beneficial for you to take aspirin daily. Ask your health care provider if you need to take a cholesterol-lowering medicine (statin). Find healthy ways to manage stress, such as: Meditation, yoga, or listening to music. Journaling. Talking to a trusted person. Spending time with friends and family. Minimize exposure to UV radiation to reduce your risk of skin cancer. Safety Always wear your seat belt while driving or riding in a vehicle. Do not drive: If you have been drinking alcohol. Do not ride with someone who has been drinking. When you are tired or distracted. While texting. If you have been using any mind-altering substances or drugs. Wear a helmet and other protective equipment during sports activities. If you have firearms in your house, make sure you follow all gun safety procedures. What's next? Visit your health care provider once a year for an annual wellness visit. Ask your health care provider how often you should have your eyes and teeth checked. Stay up to date on all vaccines. This information is not intended to replace advice given to you by your health care provider. Make sure you discuss any questions you have with your health care provider. Document Revised: 03/08/2021 Document Reviewed: 03/08/2021 Elsevier Patient Education  [place].

## 2022-10-29 NOTE — Progress Notes (Signed)
Subjective:  Patient ID: Andrea Morrow, female    DOB: Sep 30, 1950  Age: 72 y.o. MRN: [id number]  CC:  Chief Complaint  Patient presents with   Annual Exam    Pt doing well, recent falls no injuries, pt is fasting     HPI [name] presents for Annual Exam  Doing well. Son with twin grandchildren 9 months.  15 yo and 6 yo grandchild with daughter Andrea Morrow who is home currently for few weeks. Will be heading back to [place].  Bert's grandchild is 5 yo.   Hypertension: HCTZ 12.5 mg daily.  Home readings: none. No new side effects.  BP Readings from Last 3 Encounters:  10/29/22 126/78  06/09/22 (!) 141/82  06/08/22 123/89   Lab Results  Component Value Date   CREATININE 0.84 10/26/2021   Prediabetes: No meds. Diet/exercise approach.  Lab Results  Component Value Date   HGBA1C 6.4 10/26/2021   Wt Readings from Last 3 Encounters:  10/29/22 170 lb 6.4 oz (77.3 kg)  06/08/22 171 lb 8.3 oz (77.8 kg)  02/05/22 171 lb 9.6 oz (77.8 kg)     Hyperlipidemia: Crestor 10 mg daily. No new myalgias, side effects.  Lab Results  Component Value Date   CHOL 178 10/26/2021   HDL 68.10 10/26/2021   LDLCALC 88 10/26/2021   TRIG 111.0 10/26/2021   CHOLHDL 3 10/26/2021   Lab Results  Component Value Date   ALT 16 10/26/2021   AST 21 10/26/2021   ALKPHOS 61 10/26/2021   BILITOT 0.6 10/26/2021   Laryngeal pharyngeal reflux Evaluated 07/31/2022 by ENT, Dr. Constance Holster.  Trigger avoidance recommended. Improved with trigger avoidance. No meds needed. No dark stools, swallowing normally.      10/29/2022    9:18 AM 02/05/2022    1:23 PM 10/26/2021    9:04 AM 10/24/2020    3:42 PM 10/24/2020    2:38 PM  Depression screen PHQ 2/9  Decreased Interest 0 0 0 0 0  Down, Depressed, Hopeless 0 0 0 0 0  PHQ - 2 Score 0 0 0 0 0  Altered sleeping   0    Tired, decreased energy   0    Change in appetite   0    Feeling bad or failure about yourself    0    Trouble concentrating   0    Moving  slowly or fidgety/restless   0    Suicidal thoughts   0    PHQ-9 Score   0      Health Maintenance  Topic Date Due   Medicare Annual Wellness (AWV)  10/26/2022   COVID-19 Vaccine (1) 11/14/2022 (Originally 03/19/1952)   MAMMOGRAM  11/11/2023   DTaP/Tdap/Td (2 - Td or Tdap) 09/27/2024   COLONOSCOPY (Pts 45-37yr Insurance coverage will need to be confirmed)  10/08/2027   Pneumonia Vaccine 72 Years old  Completed   INFLUENZA VACCINE  Completed   DEXA SCAN  Completed   Hepatitis C Screening  Completed   Zoster Vaccines- Shingrix  Completed   HPV VACCINES  Aged Out  Colonoscopy 10/07/2017, repeat 10 years. Mammogram 11/10/2021 - scheduled in March.  DEXA scan 03/14/2021, normal, repeat 2 years.   Immunization History  Administered Date(s) Administered   Fluad Quad(high Dose 65+) 09/10/2019, 08/22/2020, 06/08/2022   Influenza,inj,Quad PF,6+ Mos 08/27/2017   Pneumococcal Conjugate-13 02/25/2018   Pneumococcal Polysaccharide-23 02/27/2019   Tdap 09/27/2014   Zoster Recombinat (Shingrix) 04/08/2018, 07/02/2018  Covid booster - declines.  RSV  vaccine - declines.    No results found. Optho yearly, glasses, appt in April. Vision ok.   Dental:  Appt next week, every 6 months.   Alcohol:none  Tobacco: none  Exercise:caring for grandchildren, active at home.    History Patient Active Problem List   Diagnosis Date Noted   Prediabetes 10/29/2022   Hyperlipidemia 03/30/2016   Essential hypertension 03/30/2016   Other screening mammogram 01/28/2014   Past Medical History:  Diagnosis Date   Hyperlipidemia    Hypertension    Past Surgical History:  Procedure Laterality Date   TUBAL LIGATION     Allergies  Allergen Reactions   Poison Ivy Extract Rash   Prior to Admission medications   Medication Sig Start Date End Date Taking? Authorizing Provider  Ascorbic Acid (VITAMIN C) 1000 MG tablet Take 1,000 mg by mouth daily.   Yes [provider]  cholecalciferol  (VITAMIN D3) 25 MCG (1000 UNIT) tablet Take 1,000 Units by mouth daily.   Yes [provider]  Ferrous Gluconate-C-Folic Acid (IRON-C PO) Take by mouth.   Yes [provider]  hydrochlorothiazide (MICROZIDE) 12.5 MG capsule Take 1 capsule (12.5 mg total) by mouth daily. 10/26/21  Yes [name], MD  Multiple Vitamins-Calcium (ONE-A-DAY WOMENS PO) Take by mouth.   Yes [provider]  rosuvastatin (CRESTOR) 10 MG tablet Take 1 tablet (10 mg total) by mouth daily. 10/26/21  Yes [name], MD   Social History   Socioeconomic History   Marital status: Married    Spouse name: Not on file   Number of children: Not on file   Years of education: Not on file   Highest education level: Not on file  Occupational History   Not on file  Tobacco Use   Smoking status: Never   Smokeless tobacco: Never  Vaping Use   Vaping Use: Never used  Substance and Sexual Activity   Alcohol use: No   Drug use: No   Sexual activity: Not Currently  Other Topics Concern   Not on file  Social History Narrative   Married   Clinical research associate   Social Determinants of Health   Financial Resource Strain: Not on file  Food Insecurity: Not on file  Transportation Needs: Not on file  Physical Activity: Not on file  Stress: Not on file  Social Connections: Not on file  Intimate Partner Violence: Not on file    Review of Systems 13 point review of systems per patient health survey noted.  Negative other than as indicated above or in HPI.    Objective:   Vitals:   10/29/22 0920  BP: 126/78  Pulse: 77  Temp: 97.8 F (36.6 C)  TempSrc: Oral  SpO2: 94%  Weight: 170 lb 6.4 oz (77.3 kg)  Height: 5' 1.5" (1.562 m)     Physical Exam Constitutional:      Appearance: She is well-developed.  HENT:     Head: Normocephalic and atraumatic.     Right Ear: External ear normal.     Left Ear: External ear normal.  Eyes:     Conjunctiva/sclera: Conjunctivae normal.      Pupils: Pupils are equal, round, and reactive to light.  Neck:     Thyroid: No thyromegaly.  Cardiovascular:     Rate and Rhythm: Normal rate and regular rhythm.     Heart sounds: Normal heart sounds. No murmur heard. Pulmonary:     Effort: Pulmonary effort is normal. No respiratory distress.  Breath sounds: Normal breath sounds. No wheezing.  Abdominal:     General: Bowel sounds are normal.     Palpations: Abdomen is soft.     Tenderness: There is no abdominal tenderness.  Musculoskeletal:        General: No tenderness. Normal range of motion.     Cervical back: Normal range of motion and neck supple.  Lymphadenopathy:     Cervical: No cervical adenopathy.  Skin:    General: Skin is warm and dry.     Findings: No rash.  Neurological:     Mental Status: She is alert and oriented to person, place, and time.  Psychiatric:        Behavior: Behavior normal.        Thought Content: Thought content normal.        Assessment & Plan:  COLLYN SELK is a 72 y.o. female . Annual physical exam  - -anticipatory guidance as below in AVS, screening labs above. Health maintenance items as above in HPI discussed/recommended as applicable.   Estrogen deficiency - Plan: DG Bone Density  Essential hypertension - Plan: Comprehensive metabolic panel, hydrochlorothiazide (MICROZIDE) 12.5 MG capsule  -  Stable, tolerating current regimen. Medications refilled. Labs pending as above.   Hyperlipidemia, unspecified hyperlipidemia type - Plan: Comprehensive metabolic panel, Lipid panel, rosuvastatin (CRESTOR) 10 MG tablet  -  Stable, tolerating current regimen. Medications refilled. Labs pending as above.   Prediabetes - Plan: Hemoglobin A1c  -Check A1c, plan on continued diet/exercise approach, 45-monthfollow-up depending on results.  Meds ordered this encounter  Medications   hydrochlorothiazide (MICROZIDE) 12.5 MG capsule    Sig: Take 1 capsule (12.5 mg total) by mouth daily.     Dispense:  90 capsule    Refill:  3   rosuvastatin (CRESTOR) 10 MG tablet    Sig: Take 1 tablet (10 mg total) by mouth daily.    Dispense:  90 tablet    Refill:  3   Patient Instructions  Thanks for coming today.  If any concerns on labs I will let you know.  649-monthollow-up for prediabetes and we can check him on blood pressure and cholesterol at that time.  I did place an order for the bone density test to be scheduled after June as that will be 2 years.  Let me know if there are questions and take care.   Preventive Care 6561ears and Older, Female Preventive care refers to lifestyle choices and visits with your health care provider that can promote health and wellness. Preventive care visits are also called wellness exams. What can I expect for my preventive care visit? Counseling Your health care provider may ask you questions about your: Medical history, including: Past medical problems. Family medical history. Pregnancy and menstrual history. History of falls. Current health, including: Memory and ability to understand (cognition). Emotional well-being. Home life and relationship well-being. Sexual activity and sexual health. Lifestyle, including: Alcohol, nicotine or tobacco, and drug use. Access to firearms. Diet, exercise, and sleep habits. Work and work enStatisticianSunscreen use. Safety issues such as seatbelt and bike helmet use. Physical exam Your health care provider will check your: Height and weight. These may be used to calculate your BMI (body mass index). BMI is a measurement that tells if you are at a healthy weight. Waist circumference. This measures the distance around your waistline. This measurement also tells if you are at a healthy weight and may help predict your risk of certain diseases,  such as type 2 diabetes and high blood pressure. Heart rate and blood pressure. Body temperature. Skin for abnormal spots. What immunizations do I  need?  Vaccines are usually given at various ages, according to a schedule. Your health care provider will recommend vaccines for you based on your age, medical history, and lifestyle or other factors, such as travel or where you work. What tests do I need? Screening Your health care provider may recommend screening tests for certain conditions. This may include: Lipid and cholesterol levels. Hepatitis C test. Hepatitis B test. HIV (human immunodeficiency virus) test. STI (sexually transmitted infection) testing, if you are at risk. Lung cancer screening. Colorectal cancer screening. Diabetes screening. This is done by checking your blood sugar (glucose) after you have not eaten for a while (fasting). Mammogram. Talk with your health care provider about how often you should have regular mammograms. BRCA-related cancer screening. This may be done if you have a family history of breast, ovarian, tubal, or peritoneal cancers. Bone density scan. This is done to screen for osteoporosis. Talk with your health care provider about your test results, treatment options, and if necessary, the need for more tests. Follow these instructions at home: Eating and drinking  Eat a diet that includes fresh fruits and vegetables, whole grains, lean protein, and low-fat dairy products. Limit your intake of foods with high amounts of sugar, saturated fats, and salt. Take vitamin and mineral supplements as recommended by your health care provider. Do not drink alcohol if your health care provider tells you not to drink. If you drink alcohol: Limit how much you have to 0-1 drink a day. Know how much alcohol is in your drink. In the [place] one drink equals one 12 oz bottle of beer (355 mL), one 5 oz glass of wine (148 mL), or one 1 oz glass of hard liquor (44 mL). Lifestyle Brush your teeth every morning and night with fluoride toothpaste. Floss one time each day. Exercise for at least 30 minutes 5 or more days  each week. Do not use any products that contain nicotine or tobacco. These products include cigarettes, chewing tobacco, and vaping devices, such as e-cigarettes. If you need help quitting, ask your health care provider. Do not use drugs. If you are sexually active, practice safe sex. Use a condom or other form of protection in order to prevent STIs. Take aspirin only as told by your health care provider. Make sure that you understand how much to take and what form to take. Work with your health care provider to find out whether it is safe and beneficial for you to take aspirin daily. Ask your health care provider if you need to take a cholesterol-lowering medicine (statin). Find healthy ways to manage stress, such as: Meditation, yoga, or listening to music. Journaling. Talking to a trusted person. Spending time with friends and family. Minimize exposure to UV radiation to reduce your risk of skin cancer. Safety Always wear your seat belt while driving or riding in a vehicle. Do not drive: If you have been drinking alcohol. Do not ride with someone who has been drinking. When you are tired or distracted. While texting. If you have been using any mind-altering substances or drugs. Wear a helmet and other protective equipment during sports activities. If you have firearms in your house, make sure you follow all gun safety procedures. What's next? Visit your health care provider once a year for an annual wellness visit. Ask your health care provider how often  you should have your eyes and teeth checked. Stay up to date on all vaccines. This information is not intended to replace advice given to you by your health care provider. Make sure you discuss any questions you have with your health care provider. Document Revised: 03/08/2021 Document Reviewed: 03/08/2021 Elsevier Patient Education  [place],   [name], MD [place] [facility] 10/29/22 10:09 AM

## 2022-11-15 ENCOUNTER — Ambulatory Visit: Payer: Medicare HMO

## 2022-11-23 ENCOUNTER — Encounter: Payer: Self-pay | Admitting: Family Medicine

## 2022-11-23 ENCOUNTER — Telehealth (INDEPENDENT_AMBULATORY_CARE_PROVIDER_SITE_OTHER): Payer: Medicare HMO | Admitting: Family Medicine

## 2022-11-23 DIAGNOSIS — J069 Acute upper respiratory infection, unspecified: Secondary | ICD-10-CM

## 2022-11-23 MED ORDER — AMOXICILLIN-POT CLAVULANATE 875-125 MG PO TABS: 1.0000 | ORAL_TABLET | Freq: Two times a day (BID) | ORAL | 0 refills | Status: AC

## 2022-11-23 MED ORDER — AMOXICILLIN-POT CLAVULANATE 875-125 MG PO TABS: 1.0000 | ORAL_TABLET | Freq: Two times a day (BID) | ORAL | 0 refills | Status: DC

## 2022-11-23 NOTE — Progress Notes (Signed)
Virtual Visit via Video Note  I connected with [name] on 11/23/22 at 11:30 AM by a video enabled telemedicine application and verified that I am speaking with the correct person using two identifiers.   Patient Location: Home Provider Location: office - [facility].    I discussed the limitations, risks, security and privacy concerns of performing an evaluation and management service by telephone and the availability of in person appointments. I also discussed with the patient that there may be a patient responsible charge related to this service. The patient expressed understanding and agreed to proceed, consent obtained  Chief Complaint  Patient presents with   Nasal Congestion    Pt reports nasal congestion for 2 days. Vicks Dayquil and Nyquil. Pt states her whole family of 8 has been sick for 2 weeks.    History of Present Illness: Andrea Morrow is a 72 y.o. female presenting with nasal congestion and drainage, sneezing, a non productive cough, and fatigue. Symptoms started on Monday.   Denies chest pain, shortness of breath, fever, sore throat, ear pain or drainage, nausea, vomiting, or diarrhea.   She has not performed a home test for covid or influenza.   She started taking DayQuil and NightQuil on Wednesday. She reports the NightQuil didn't relieve symptoms last night.   Patient has had 4 other families that live in the house with the same symptoms. Diagnosed with common cold and given antibiotics, Augmentin and Azithromycin.    Patient Active Problem List   Diagnosis Date Noted   Prediabetes 10/29/2022   Hyperlipidemia 03/30/2016   Essential hypertension 03/30/2016   Other screening mammogram 01/28/2014   Past Medical History:  Diagnosis Date   Hyperlipidemia    Hypertension    Past Surgical History:  Procedure Laterality Date   TUBAL LIGATION     Allergies  Allergen Reactions   Poison Ivy Extract Rash   Prior to Admission medications    Medication Sig Start Date End Date Taking? Authorizing Provider  Ascorbic Acid (VITAMIN C) 1000 MG tablet Take 1,000 mg by mouth daily.   Yes [provider]  cholecalciferol (VITAMIN D3) 25 MCG (1000 UNIT) tablet Take 1,000 Units by mouth daily.   Yes [provider]  Ferrous Gluconate-C-Folic Acid (IRON-C PO) Take by mouth.   Yes [provider]  hydrochlorothiazide (MICROZIDE) 12.5 MG capsule Take 1 capsule (12.5 mg total) by mouth daily. 10/29/22  Yes [name], MD  Multiple Vitamins-Calcium (ONE-A-DAY WOMENS PO) Take by mouth.   Yes [provider]  rosuvastatin (CRESTOR) 10 MG tablet Take 1 tablet (10 mg total) by mouth daily. 10/29/22  Yes [name], MD   Social History   Socioeconomic History   Marital status: Married    Spouse name: Not on file   Number of children: Not on file   Years of education: Not on file   Highest education level: Not on file  Occupational History   Not on file  Tobacco Use   Smoking status: Never   Smokeless tobacco: Never  Vaping Use   Vaping Use: Never used  Substance and Sexual Activity   Alcohol use: No   Drug use: No   Sexual activity: Not Currently  Other Topics Concern   Not on file  Social History Narrative   Married   Clinical research associate   Social Determinants of Health   Financial Resource Strain: Not on file  Food Insecurity: Not on file  Transportation Needs: Not  on file  Physical Activity: Not on file  Stress: Not on file  Social Connections: Not on file  Intimate Partner Violence: Not on file    Observations/Objective: There were no vitals filed for this visit. Physical Exam Constitutional:      General: She is not in acute distress.    Appearance: She is ill-appearing. She is not toxic-appearing or diaphoretic.  HENT:     Head: Normocephalic and atraumatic.     Nose: Congestion present.     Comments: Sounded congested and sniffling  Eyes:     General:        Right  eye: No discharge.        Left eye: No discharge.     Conjunctiva/sclera: Conjunctivae normal.  Pulmonary:     Effort: Pulmonary effort is normal. No respiratory distress.  Neurological:     Mental Status: She is alert and oriented to person, place, and time.  Psychiatric:        Mood and Affect: Mood normal.        Behavior: Behavior normal.        Thought Content: Thought content normal.        Judgment: Judgment normal.    Assessment and Plan: Upper respiratory tract infection, unspecified type -     Amoxicillin-Pot Clavulanate; Take 1 tablet by mouth 2 (two) times daily.  Dispense: 20 tablet; Refill: 0  -Recommend to change DayQuil and NightQuil to Mucinex if medication is not relieving symptoms.  -Stay hydrated, at least 64oz of fluids a day.  -Rest  -If she develops chest pain or shortness of breath, recommended to go to the closes emergency department.  -Recommend to continue with taking vitamin C/D, and zinc to support immune system for the next 30 days. Recommend to take zinc with food or at bedtime if it irritates your stomach.  -Prescribed Augmentin since patient has had symptoms since the beginning of week and all other family members in the house are being treated with antibiotic with response to medication.  Follow Up Instructions:   I discussed the assessment and treatment plan with the patient. The patient was provided an opportunity to ask questions and all were answered. The patient agreed with the plan and demonstrated an understanding of the instructions.   The patient was advised to call back or seek an in-person evaluation if the symptoms worsen or if the condition fails to improve as anticipated.  [name], NP

## 2022-11-23 NOTE — Addendum Note (Signed)
Addended by: [name] R on: 11/23/2022 12:43 PM   Modules accepted: Orders

## 2022-11-26 ENCOUNTER — Ambulatory Visit
Admission: RE | Admit: 2022-11-26 | Discharge: 2022-11-26 | Disposition: A | Discharge status: Home / Self Care | Payer: Medicare HMO | Source: Ambulatory Visit | Attending: Family Medicine | Admitting: Family Medicine

## 2022-11-26 DIAGNOSIS — Z1231 Encounter for screening mammogram for malignant neoplasm of breast: Secondary | ICD-10-CM

## 2022-12-13 ENCOUNTER — Ambulatory Visit (INDEPENDENT_AMBULATORY_CARE_PROVIDER_SITE_OTHER): Payer: Medicare HMO | Admitting: Family Medicine

## 2022-12-13 DIAGNOSIS — Z0000 Encounter for general adult medical examination without abnormal findings: Secondary | ICD-10-CM

## 2022-12-13 NOTE — Progress Notes (Signed)
Subjective:   Andrea Morrow is a 72 y.o. female who presents for Medicare Annual (Subsequent) preventive examination.  I connected with  BRIANAH BAVA on 12/13/22 by a telephone enabled telemedicine application and verified that I am speaking with the correct person using two identifiers.   I discussed the limitations of evaluation and management by telemedicine. The patient expressed understanding and agreed to proceed.  Patient location: home  Provider location: home/telephone    Review of Systems     Cardiac Risk Factors include: advanced age (>46men, >56 women);family history of premature cardiovascular disease;hypertension     Objective:    Today's Vitals   There is no height or weight on file to calculate BMI.     12/13/2022   10:31 AM 06/08/2022    8:49 PM 10/26/2021    9:07 AM 10/24/2020    2:44 PM 02/27/2019    9:02 AM 02/25/2018    8:40 AM 10/07/2017    1:58 PM  Advanced Directives  Does Patient Have a Medical Advance Directive? Yes No Yes Yes No No No  Type of Paramedic of [place];Living will;Out of facility DNR (pink MOST or yellow form)     Does patient want to make changes to medical advance directive?   No - Patient declined      Copy of [place] in Chart? No - copy requested   No - copy requested     Would patient like information on creating a medical advance directive?   No - Patient declined  Yes (ED - Information included in AVS) Yes (MAU/Ambulatory/Procedural Areas - Information given)     Current Medications (verified) Outpatient Encounter Medications as of 12/13/2022  Medication Sig   Ascorbic Acid (VITAMIN C) 1000 MG tablet Take 1,000 mg by mouth daily.   cholecalciferol (VITAMIN D3) 25 MCG (1000 UNIT) tablet Take 1,000 Units by mouth daily.   Ferrous Gluconate-C-Folic Acid (IRON-C PO) Take by mouth.   hydrochlorothiazide (MICROZIDE) 12.5 MG capsule Take 1 capsule (12.5 mg  total) by mouth daily.   Multiple Vitamins-Calcium (ONE-A-DAY WOMENS PO) Take by mouth.   rosuvastatin (CRESTOR) 10 MG tablet Take 1 tablet (10 mg total) by mouth daily.   zinc gluconate 50 MG tablet Take 50 mg by mouth daily.   No facility-administered encounter medications on file as of 12/13/2022.    Allergies (verified) Poison ivy extract   History: Past Medical History:  Diagnosis Date   Hyperlipidemia    Hypertension    Past Surgical History:  Procedure Laterality Date   TUBAL LIGATION     Family History  Problem Relation Age of Onset   Cancer Mother    Hyperlipidemia Mother    Heart disease Brother    Colon cancer Neg Hx    Esophageal cancer Neg Hx    Pancreatic cancer Neg Hx    Rectal cancer Neg Hx    Stomach cancer Neg Hx    Social History   Socioeconomic History   Marital status: Married    Spouse name: Not on file   Number of children: Not on file   Years of education: Not on file   Highest education level: Not on file  Occupational History   Not on file  Tobacco Use   Smoking status: Never   Smokeless tobacco: Never  Vaping Use   Vaping Use: Never used  Substance and Sexual Activity   Alcohol use: No  Drug use: No   Sexual activity: Not Currently  Other Topics Concern   Not on file  Social History Narrative   Married   Clinical research associate   Social Determinants of Health   Financial Resource Strain: Low Risk  (12/13/2022)   Overall Financial Resource Strain (CARDIA)    Difficulty of Paying Living Expenses: Not very hard  Food Insecurity: No Food Insecurity (12/13/2022)   Hunger Vital Sign    Worried About Running Out of Food in the Last Year: Never true    Ran Out of Food in the Last Year: Never true  Transportation Needs: No Transportation Needs (12/13/2022)   PRAPARE - Hydrologist (Medical): No    Lack of Transportation (Non-Medical): No  Physical Activity: Not on file  Stress: Not on file  Social Connections:  Moderately Integrated (12/13/2022)   Social Connection and Isolation Panel [NHANES]    Frequency of Communication with Friends and Family: Three times a week    Frequency of Social Gatherings with Friends and Family: Once a week    Attends Religious Services: More than 4 times per year    Active Member of [employer] or Organizations: No    Attends Music therapist: Never    Marital Status: Married    Tobacco Counseling Counseling given: Not Answered   Clinical Intake:  Pre-visit preparation completed: Yes  Pain : No/denies pain     Diabetes: No     Diabetic   NO  Interpreter Needed?: No  Information entered by :: Leroy Kennedy LPN   Activities of Daily Living    12/13/2022   10:34 AM  In your present state of health, do you have any difficulty performing the following activities:  Hearing? 0  Vision? 0  Difficulty concentrating or making decisions? 0  Walking or climbing stairs? 0  Dressing or bathing? 0  Doing errands, shopping? 0  Preparing Food and eating ? N  Using the Toilet? N  In the past six months, have you accidently leaked urine? N  Do you have problems with loss of bowel control? N  Managing your Medications? N  Managing your Finances? N  Housekeeping or managing your Housekeeping? N    Patient Care Team: [name], MD as PCP - General (Family Medicine) Buford Dresser, MD as PCP - Cardiology (Cardiology) Buford Dresser, MD as Consulting Physician (Cardiology)  Indicate any recent Medical Services you may have received from other than [facility] providers in the past year (date may be approximate).     Assessment:   This is a routine wellness examination for Andrea Morrow.  Hearing/Vision screen Hearing Screening - Comments:: No trouble hearing Vision Screening - Comments:: Up to date [facility]  Dietary issues and exercise activities discussed: Current Exercise Habits: The patient does not participate in regular exercise at  present   Goals Addressed             This Visit's Progress    Patient Stated       No goals       Depression Screen    12/13/2022   10:36 AM 10/29/2022    9:18 AM 02/05/2022    1:23 PM 10/26/2021    9:04 AM 10/24/2020    3:42 PM 10/24/2020    2:38 PM 03/14/2020    1:50 PM  PHQ 2/9 Scores  PHQ - 2 Score 0 0 0 0 0 0 0  PHQ- 9 Score 1   0  Fall Risk    12/13/2022   10:31 AM 10/29/2022    9:18 AM 02/05/2022    1:23 PM 10/26/2021    9:04 AM 10/24/2020    3:42 PM  Fall Risk   Falls in the past year? 0 0 0 0 0  Number falls in past yr: 0 1 0 0   Injury with Fall? 0 0 0 0   Risk for fall due to :  History of fall(s) No Fall Risks No Fall Risks   Follow up Education provided;Falls evaluation completed;Falls prevention discussed Education provided Falls evaluation completed Falls evaluation completed Falls evaluation completed    FALL RISK PREVENTION PERTAINING TO THE HOME:  Any stairs in or around the home? No  If so, are there any without handrails? No  Home free of loose throw rugs in walkways, pet beds, electrical cords, etc? Yes  Adequate lighting in your home to reduce risk of falls? Yes   ASSISTIVE DEVICES UTILIZED TO PREVENT FALLS:  Life alert? No  Use of a cane, walker or w/c? No  Grab bars in the bathroom? Yes  Shower chair or bench in shower? Yes  Elevated toilet seat or a handicapped toilet? Yes   TIMED UP AND GO:  Was the test performed? No .    Cognitive Function:        12/13/2022   10:32 AM 10/26/2021    9:04 AM 10/24/2020    2:39 PM 02/27/2019    9:00 AM 02/25/2018    8:38 AM  6CIT Screen  What Year? 0 points 0 points 0 points 0 points 0 points  What month? 0 points 0 points 0 points 0 points 0 points  What time? 0 points 0 points 0 points 0 points 0 points  Count back from 20 0 points 0 points 0 points 0 points 0 points  Months in reverse 0 points 0 points 0 points 0 points 0 points  Repeat phrase 0 points 0 points 0 points 0 points 0 points   Total Score 0 points 0 points 0 points 0 points 0 points    Immunizations Immunization History  Administered Date(s) Administered   Fluad Quad(high Dose 65+) 09/10/2019, 08/22/2020, 06/08/2022   Influenza,inj,Quad PF,6+ Mos 08/27/2017   Pneumococcal Conjugate-13 02/25/2018   Pneumococcal Polysaccharide-23 02/27/2019   Tdap 09/27/2014   Zoster Recombinat (Shingrix) 04/08/2018, 07/02/2018    TDAP status: Up to date  Flu Vaccine status: Up to date  Pneumococcal vaccine status: Up to date  Covid-19 vaccine status: Information provided on how to obtain vaccines.   Qualifies for Shingles Vaccine? No   Zostavax completed Yes   Shingrix Completed?: Yes  Screening Tests Health Maintenance  Topic Date Due   COVID-19 Vaccine (1) Never done   Medicare Annual Wellness (AWV)  12/13/2023   DTaP/Tdap/Td (2 - Td or Tdap) 09/27/2024   MAMMOGRAM  11/25/2024   COLONOSCOPY (Pts 45-33yrs Insurance coverage will need to be confirmed)  10/08/2027   Pneumonia Vaccine 94+ Years old  Completed   INFLUENZA VACCINE  Completed   DEXA SCAN  Completed   Hepatitis C Screening  Completed   Zoster Vaccines- Shingrix  Completed   HPV VACCINES  Aged Out    Health Maintenance  Health Maintenance Due  Topic Date Due   COVID-19 Vaccine (1) Never done    Colorectal cancer screening: No longer required.   Mammogram status: Completed 2024. Repeat every year  Bone Density scheduled  Lung Cancer Screening: (Low Dose CT Chest recommended  if Age 76-80 years, 74 pack-year currently smoking OR have quit w/in 15years.) does not qualify.   Lung Cancer Screening Referral:   Additional Screening:  Hepatitis C Screening: does not qualify; Completed 2018  Vision Screening: Recommended annual ophthalmology exams for early detection of glaucoma and other disorders of the eye. Is the patient up to date with their annual eye exam?  Yes  Who is the provider or what is the name of the office in which the  patient attends annual eye exams? [facility] If pt is not established with a provider, would they like to be referred to a provider to establish care? No .   Dental Screening: Recommended annual dental exams for proper oral hygiene  Community Resource Referral / Chronic Care Management: CRR required this visit?  No   CCM required this visit?  No      Plan:     I have personally reviewed and noted the following in the patient's chart:   Medical and social history Use of alcohol, tobacco or illicit drugs  Current medications and supplements including opioid prescriptions. Patient is not currently taking opioid prescriptions. Functional ability and status Nutritional status Physical activity Advanced directives List of other physicians Hospitalizations, surgeries, and ER visits in previous 12 months Vitals Screenings to include cognitive, depression, and falls Referrals and appointments  In addition, I have reviewed and discussed with patient certain preventive protocols, quality metrics, and best practice recommendations. A written personalized care plan for preventive services as well as general preventive health recommendations were provided to patient.     Leroy Kennedy, LPN   [id number]   Nurse Notes:

## 2022-12-13 NOTE — Patient Instructions (Signed)
Andrea Morrow , Thank you for taking time to come for your Medicare Wellness Visit. I appreciate your ongoing commitment to your health goals. Please review the following plan we discussed and let me know if I can assist you in the future.   Screening recommendations/referrals: Colonoscopy: up to date Mammogram: up to date Bone Density: up to date scheduled Recommended yearly ophthalmology/optometry visit for glaucoma screening and checkup Recommended yearly dental visit for hygiene and checkup  Vaccinations: Influenza vaccine: up to date Pneumococcal vaccine: up to date Tdap vaccine: up to date Shingles vaccine: up to date       Preventive Care 20 Years and Older, Female Preventive care refers to lifestyle choices and visits with your health care provider that can promote health and wellness. What does preventive care include? A yearly physical exam. This is also called an annual well check. Dental exams once or twice a year. Routine eye exams. Ask your health care provider how often you should have your eyes checked. Personal lifestyle choices, including: Daily care of your teeth and gums. Regular physical activity. Eating a healthy diet. Avoiding tobacco and drug use. Limiting alcohol use. Practicing safe sex. Taking low-dose aspirin every day. Taking vitamin and mineral supplements as recommended by your health care provider. What happens during an annual well check? The services and screenings done by your health care provider during your annual well check will depend on your age, overall health, lifestyle risk factors, and family history of disease. Counseling  Your health care provider may ask you questions about your: Alcohol use. Tobacco use. Drug use. Emotional well-being. Home and relationship well-being. Sexual activity. Eating habits. History of falls. Memory and ability to understand (cognition). Work and work Statistician. Reproductive health. Screening   You may have the following tests or measurements: Height, weight, and BMI. Blood pressure. Lipid and cholesterol levels. These may be checked every 5 years, or more frequently if you are over 64 years old. Skin check. Lung cancer screening. You may have this screening every year starting at age 41 if you have a 30-pack-year history of smoking and currently smoke or have quit within the past 15 years. Fecal occult blood test (FOBT) of the stool. You may have this test every year starting at age 25. Flexible sigmoidoscopy or colonoscopy. You may have a sigmoidoscopy every 5 years or a colonoscopy every 10 years starting at age 68. Hepatitis C blood test. Hepatitis B blood test. Sexually transmitted disease (STD) testing. Diabetes screening. This is done by checking your blood sugar (glucose) after you have not eaten for a while (fasting). You may have this done every 1-3 years. Bone density scan. This is done to screen for osteoporosis. You may have this done starting at age 72. Mammogram. This may be done every 1-2 years. Talk to your health care provider about how often you should have regular mammograms. Talk with your health care provider about your test results, treatment options, and if necessary, the need for more tests. Vaccines  Your health care provider may recommend certain vaccines, such as: Influenza vaccine. This is recommended every year. Tetanus, diphtheria, and acellular pertussis (Tdap, Td) vaccine. You may need a Td booster every 10 years. Zoster vaccine. You may need this after age 58. Pneumococcal 13-valent conjugate (PCV13) vaccine. One dose is recommended after age 60. Pneumococcal polysaccharide (PPSV23) vaccine. One dose is recommended after age 59. Talk to your health care provider about which screenings and vaccines you need and how often you need  them. This information is not intended to replace advice given to you by your health care provider. Make sure you discuss  any questions you have with your health care provider. Document Released: 10/07/2015 Document Revised: 05/30/2016 Document Reviewed: 07/12/2015 Elsevier Interactive Patient Education  2017 [place] Prevention in the Home Falls can cause injuries. They can happen to people of all ages. There are many things you can do to make your home safe and to help prevent falls. What can I do on the outside of my home? Regularly fix the edges of walkways and driveways and fix any cracks. Remove anything that might make you trip as you walk through a door, such as a raised step or threshold. Trim any bushes or trees on the path to your home. Use bright outdoor lighting. Clear any walking paths of anything that might make someone trip, such as rocks or tools. Regularly check to see if handrails are loose or broken. Make sure that both sides of any steps have handrails. Any raised decks and porches should have guardrails on the edges. Have any leaves, snow, or ice cleared regularly. Use sand or salt on walking paths during winter. Clean up any spills in your garage right away. This includes oil or grease spills. What can I do in the bathroom? Use night lights. Install grab bars by the toilet and in the tub and shower. Do not use towel bars as grab bars. Use non-skid mats or decals in the tub or shower. If you need to sit down in the shower, use a plastic, non-slip stool. Keep the floor dry. Clean up any water that spills on the floor as soon as it happens. Remove soap buildup in the tub or shower regularly. Attach bath mats securely with double-sided non-slip rug tape. Do not have throw rugs and other things on the floor that can make you trip. What can I do in the bedroom? Use night lights. Make sure that you have a light by your bed that is easy to reach. Do not use any sheets or blankets that are too big for your bed. They should not hang down onto the floor. Have a firm chair that has  side arms. You can use this for support while you get dressed. Do not have throw rugs and other things on the floor that can make you trip. What can I do in the kitchen? Clean up any spills right away. Avoid walking on wet floors. Keep items that you use a lot in easy-to-reach places. If you need to reach something above you, use a strong step stool that has a grab bar. Keep electrical cords out of the way. Do not use floor polish or wax that makes floors slippery. If you must use wax, use non-skid floor wax. Do not have throw rugs and other things on the floor that can make you trip. What can I do with my stairs? Do not leave any items on the stairs. Make sure that there are handrails on both sides of the stairs and use them. Fix handrails that are broken or loose. Make sure that handrails are as long as the stairways. Check any carpeting to make sure that it is firmly attached to the stairs. Fix any carpet that is loose or worn. Avoid having throw rugs at the top or bottom of the stairs. If you do have throw rugs, attach them to the floor with carpet tape. Make sure that you have a light switch at  the top of the stairs and the bottom of the stairs. If you do not have them, ask someone to add them for you. What else can I do to help prevent falls? Wear shoes that: Do not have high heels. Have rubber bottoms. Are comfortable and fit you well. Are closed at the toe. Do not wear sandals. If you use a stepladder: Make sure that it is fully opened. Do not climb a closed stepladder. Make sure that both sides of the stepladder are locked into place. Ask someone to hold it for you, if possible. Clearly mark and make sure that you can see: Any grab bars or handrails. First and last steps. Where the edge of each step is. Use tools that help you move around (mobility aids) if they are needed. These include: Canes. Walkers. Scooters. Crutches. Turn on the lights when you go into a dark area.  Replace any light bulbs as soon as they burn out. Set up your furniture so you have a clear path. Avoid moving your furniture around. If any of your floors are uneven, fix them. If there are any pets around you, be aware of where they are. Review your medicines with your doctor. Some medicines can make you feel dizzy. This can increase your chance of falling. Ask your doctor what other things that you can do to help prevent falls. This information is not intended to replace advice given to you by your health care provider. Make sure you discuss any questions you have with your health care provider. Document Released: 07/07/2009 Document Revised: 02/16/2016 Document Reviewed: 10/15/2014 Elsevier Interactive Patient Education  2017 [employer].

## 2023-02-04 ENCOUNTER — Encounter: Payer: Self-pay | Admitting: Family Medicine

## 2023-02-04 ENCOUNTER — Ambulatory Visit (INDEPENDENT_AMBULATORY_CARE_PROVIDER_SITE_OTHER): Payer: Medicare HMO | Admitting: Family Medicine

## 2023-02-04 VITALS — BP 122/74 | HR 72 | Temp 98.0°F | Resp 16 | Ht 61.0 in | Wt 168.0 lb

## 2023-02-04 DIAGNOSIS — J3489 Other specified disorders of nose and nasal sinuses: Secondary | ICD-10-CM | POA: Diagnosis not present

## 2023-02-04 DIAGNOSIS — H6691 Otitis media, unspecified, right ear: Secondary | ICD-10-CM

## 2023-02-04 MED ORDER — AMOXICILLIN-POT CLAVULANATE 875-125 MG PO TABS: 1.0000 | ORAL_TABLET | Freq: Two times a day (BID) | ORAL | 0 refills | Status: DC

## 2023-02-04 NOTE — Patient Instructions (Addendum)
Ok to try nasacort for runny nose. Zyrtec, claritin or allegra can help if allergies causing runny nose. Augmentin for ear infection.  I expect symptoms to improve this week, Tylenol if needed for pain.  If hearing does not improve as infection improves, return for recheck or be seen by other medical provider sooner if needed.  Hope you feel better soon.  Otitis Media, Adult  Otitis media occurs when there is inflammation and fluid in the middle ear with signs and symptoms of an acute infection. The middle ear is a part of the ear that contains bones for hearing as well as air that helps send sounds to the brain. When infected fluid builds up in this space, it causes pressure and can lead to an ear infection. The eustachian tube connects the middle ear to the back of the nose (nasopharynx) and normally allows air into the middle ear. If the eustachian tube becomes blocked, fluid can build up and become infected. What are the causes? This condition is caused by a blockage in the eustachian tube. This can be caused by mucus or by swelling of the tube. Problems that can cause a blockage include: A cold or other upper respiratory infection. Allergies. An irritant, such as tobacco smoke. Enlarged adenoids. The adenoids are areas of soft tissue located high in the back of the throat, behind the nose and the roof of the mouth. They are part of the body's defense system (immune system). A mass in the nasopharynx. Damage to the ear caused by pressure changes (barotrauma). What increases the risk? You are more likely to develop this condition if you: Smoke or are exposed to tobacco smoke. Have an opening in the roof of your mouth (cleft palate). Have gastroesophageal reflux. Have an immune system disorder. What are the signs or symptoms? Symptoms of this condition include: Ear pain. Fever. Decreased hearing. Tiredness (lethargy). Fluid leaking from the ear, if the eardrum is ruptured or has  burst. Ringing in the ear. How is this diagnosed?  This condition is diagnosed with a physical exam. During the exam, your health care provider will use an instrument called an otoscope to look in your ear and check for redness, swelling, and fluid. He or she will also ask about your symptoms. Your health care provider may also order tests, such as: A pneumatic otoscopy. This is a test to check the movement of the eardrum. It is done by squeezing a small amount of air into the ear. A tympanogram. This is a test that shows how well the eardrum moves in response to air pressure in the ear canal. It provides a graph for your health care provider to review. How is this treated? This condition can go away on its own within 3-5 days. But if the condition is caused by a bacterial infection and does not go away on its own, or if it keeps coming back, your health care provider may: Prescribe antibiotic medicine to treat the infection. Prescribe or recommend medicines to control pain. Follow these instructions at home: Take over-the-counter and prescription medicines only as told by your health care provider. If you were prescribed an antibiotic medicine, take it as told by your health care provider. Do not stop taking the antibiotic even if you start to feel better. Keep all follow-up visits. This is important. Contact a health care provider if: You have bleeding from your nose. There is a lump on your neck. You are not feeling better in 5 days. You feel  worse instead of better. Get help right away if: You have severe pain that is not controlled with medicine. You have swelling, redness, or pain around your ear. You have stiffness in your neck. A part of your face is not moving (paralyzed). The bone behind your ear (mastoid bone) is tender when you touch it. You develop a severe headache. Summary Otitis media is redness, soreness, and swelling of the middle ear, usually resulting in pain and  decreased hearing. This condition can go away on its own within 3-5 days. If the problem does not go away in 3-5 days, your health care provider may give you medicines to treat the infection. If you were prescribed an antibiotic medicine, take it as told by your health care provider. Follow all instructions that were given to you by your health care provider. This information is not intended to replace advice given to you by your health care provider. Make sure you discuss any questions you have with your health care provider. Document Revised: 12/19/2020 Document Reviewed: 12/19/2020 Elsevier Patient Education  2023 [employer].

## 2023-02-04 NOTE — Progress Notes (Signed)
Subjective:  Patient ID: Andrea Morrow, female    DOB: 1951/08/10  Age: 72 y.o. MRN: [id number]  CC:  Chief Complaint  Patient presents with   Ear Fullness    Here for ear fulliness     HPI ETTER CLAYTOR presents for   R ear pain: Started Friday night - 3 nights ago after plane travel to [place]. Unable to clear. Some runny nose past 2 weeks. No fever. HA one day.  No d/c.  Slightly decreased hearing on R side. Left ear feels clogged at times as well.  Grandchild with ear infections  Tx: tylenol. Sudafed - past few days.     History Patient Active Problem List   Diagnosis Date Noted   Prediabetes 10/29/2022   Hyperlipidemia 03/30/2016   Essential hypertension 03/30/2016   Other screening mammogram 01/28/2014   Past Medical History:  Diagnosis Date   Hyperlipidemia    Hypertension    Past Surgical History:  Procedure Laterality Date   TUBAL LIGATION     Allergies  Allergen Reactions   Poison Ivy Extract Rash   Prior to Admission medications   Medication Sig Start Date End Date Taking? Authorizing Provider  Ascorbic Acid (VITAMIN C) 1000 MG tablet Take 1,000 mg by mouth daily.   Yes [provider]  cholecalciferol (VITAMIN D3) 25 MCG (1000 UNIT) tablet Take 1,000 Units by mouth daily.   Yes [provider]  Ferrous Gluconate-C-Folic Acid (IRON-C PO) Take by mouth.   Yes [provider]  hydrochlorothiazide (MICROZIDE) 12.5 MG capsule Take 1 capsule (12.5 mg total) by mouth daily. 10/29/22  Yes Shade Flood, MD  Multiple Vitamins-Calcium (ONE-A-DAY WOMENS PO) Take by mouth.   Yes [provider]  rosuvastatin (CRESTOR) 10 MG tablet Take 1 tablet (10 mg total) by mouth daily. 10/29/22  Yes Shade Flood, MD  zinc gluconate 50 MG tablet Take 50 mg by mouth daily.   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   Social Determinants of Health   Financial Resource Strain: Low Risk  (12/13/2022)   Overall Financial Resource Strain (CARDIA)    Difficulty of Paying Living Expenses: Not very hard  Food Insecurity: No Food Insecurity (12/13/2022)   Hunger Vital Sign    Worried About Running Out of Food in the Last Year: Never true    Ran Out of Food in the Last Year: Never true  Transportation Needs: No Transportation Needs (12/13/2022)   PRAPARE - Administrator, Civil Service (Medical): No    Lack of Transportation (Non-Medical): No  Physical Activity: Not on file  Stress: Not on file  Social Connections: Moderately Integrated (12/13/2022)   Social Connection and Isolation Panel [NHANES]    Frequency of Communication with Friends and Family: Three times a week  Frequency of Social Gatherings with Friends and Family: Once a week    Attends Religious Services: More than 4 times per year    Active Member of [employer] or Organizations: No    Attends Banker Meetings: Never    Marital Status: Married  Catering manager Violence: Not At Risk (12/13/2022)   Humiliation, Afraid, Rape, and Kick questionnaire    Fear of Current or Ex-Partner: No    Emotionally Abused: No    Physically Abused: No    Sexually Abused: No    Review of Systems Per HPI  Objective:   Vitals:   02/04/23 1135  BP: 122/74  Pulse: 72  Resp: 16  Temp: 98 F (36.7 C)  TempSrc: Oral  SpO2: 98%  Weight: 168 lb (76.2 kg)  Height: 5\' 1"  (1.549 m)     Physical Exam Vitals reviewed.   Constitutional:      General: She is not in acute distress.    Appearance: She is well-developed.  HENT:     Head: Normocephalic and atraumatic.     Right Ear: Hearing, ear canal and external ear normal.     Left Ear: Hearing, tympanic membrane, ear canal and external ear normal.     Ears:     Comments: Right ear, erythematous, retracted TM.  Slight discolored fluid at base.  Canal clear.  Minimal discomfort anterior to right ear but pinnae nontender, mastoid nontender.    Nose: Rhinorrhea (clear.) present.     Mouth/Throat:     Pharynx: No posterior oropharyngeal erythema.  Eyes:     Conjunctiva/sclera: Conjunctivae normal.     Pupils: Pupils are equal, round, and reactive to light.  Cardiovascular:     Rate and Rhythm: Normal rate and regular rhythm.     Heart sounds: Normal heart sounds. No murmur heard. Pulmonary:     Effort: Pulmonary effort is normal. No respiratory distress.     Breath sounds: Normal breath sounds. No wheezing or rhonchi.  Skin:    General: Skin is warm and dry.     Findings: No rash.  Neurological:     Mental Status: She is alert and oriented to person, place, and time.  Psychiatric:        Mood and Affect: Mood normal.        Behavior: Behavior normal.        Assessment & Plan:  Andrea Morrow is a 72 y.o. female . Right otitis media, unspecified otitis media type - Plan: amoxicillin-clavulanate (AUGMENTIN) 875-125 MG tablet  Rhinorrhea Right otitis media, likely secondary to initial rhinorrhea from URI versus allergies with secondary bacterial infection.  May have had a component of eustachian tube dysfunction as well with traveling.  Does appear infected this morning, start Augmentin with potential side effects discussed, Tylenol if needed for pain.  Antihistamine, short-term nasal steroid if needed for rhinorrhea with RTC precautions.  Meds ordered this encounter  Medications   amoxicillin-clavulanate (AUGMENTIN) 875-125 MG tablet    Sig:  Take 1 tablet by mouth 2 (two) times daily.    Dispense:  20 tablet    Refill:  0   Patient Instructions  Ok to try nasacort for runny nose. Zyrtec, claritin or allegra can help if allergies causing runny nose. Augmentin for ear infection.  I expect symptoms to improve this week, Tylenol if needed for pain.  If hearing does not improve as infection improves, return for recheck or be seen by other medical provider sooner if needed.  Hope you feel better soon.  Otitis Media, Adult  Otitis media occurs when there is inflammation and fluid in the middle ear with signs and symptoms of an acute infection. The middle ear is a part of the ear that contains bones for hearing as well as air that helps send sounds to the brain. When infected fluid builds up in this space, it causes pressure and can lead to an ear infection. The eustachian tube connects the middle ear to the back of the nose (nasopharynx) and normally allows air into the middle ear. If the eustachian tube becomes blocked, fluid can build up and become infected. What are the causes? This condition is caused by a blockage in the eustachian tube. This can be caused by mucus or by swelling of the tube. Problems that can cause a blockage include: A cold or other upper respiratory infection. Allergies. An irritant, such as tobacco smoke. Enlarged adenoids. The adenoids are areas of soft tissue located high in the back of the throat, behind the nose and the roof of the mouth. They are part of the body's defense system (immune system). A mass in the nasopharynx. Damage to the ear caused by pressure changes (barotrauma). What increases the risk? You are more likely to develop this condition if you: Smoke or are exposed to tobacco smoke. Have an opening in the roof of your mouth (cleft palate). Have gastroesophageal reflux. Have an immune system disorder. What are the signs or symptoms? Symptoms of this condition include: Ear  pain. Fever. Decreased hearing. Tiredness (lethargy). Fluid leaking from the ear, if the eardrum is ruptured or has burst. Ringing in the ear. How is this diagnosed?  This condition is diagnosed with a physical exam. During the exam, your health care provider will use an instrument called an otoscope to look in your ear and check for redness, swelling, and fluid. He or she will also ask about your symptoms. Your health care provider may also order tests, such as: A pneumatic otoscopy. This is a test to check the movement of the eardrum. It is done by squeezing a small amount of air into the ear. A tympanogram. This is a test that shows how well the eardrum moves in response to air pressure in the ear canal. It provides a graph for your health care provider to review. How is this treated? This condition can go away on its own within 3-5 days. But if the condition is caused by a bacterial infection and does not go away on its own, or if it keeps coming back, your health care provider may: Prescribe antibiotic medicine to treat the infection. Prescribe or recommend medicines to control pain. Follow these instructions at home: Take over-the-counter and prescription medicines only as told by your health care provider. If you were prescribed an antibiotic medicine, take it as told by your health care provider. Do not stop taking the antibiotic even if you start to feel better. Keep all follow-up visits. This is important. Contact a health care provider if: You have bleeding from your nose. There is a lump on your neck. You are not feeling better in 5 days. You feel worse instead of better. Get help right away if: You have severe pain that is not controlled with medicine. You have swelling, redness, or pain around your ear. You have stiffness in your neck. A part of your face is not moving (paralyzed). The bone behind your ear (mastoid bone) is tender when you touch it. You develop  a severe  headache. Summary Otitis media is redness, soreness, and swelling of the middle ear, usually resulting in pain and decreased hearing. This condition can go away on its own within 3-5 days. If the problem does not go away in 3-5 days, your health care provider may give you medicines to treat the infection. If you were prescribed an antibiotic medicine, take it as told by your health care provider. Follow all instructions that were given to you by your health care provider. This information is not intended to replace advice given to you by your health care provider. Make sure you discuss any questions you have with your health care provider. Document Revised: 12/19/2020 Document Reviewed: 12/19/2020 Elsevier Patient Education  2023 Elsevier Inc.     Signed,   Meredith Staggers, MD [facility], [facility] 02/04/23 12:43 PM

## 2023-02-08 ENCOUNTER — Encounter: Payer: Self-pay | Admitting: Family Medicine

## 2023-02-08 DIAGNOSIS — H938X1 Other specified disorders of right ear: Secondary | ICD-10-CM

## 2023-02-08 DIAGNOSIS — H6691 Otitis media, unspecified, right ear: Secondary | ICD-10-CM

## 2023-02-26 NOTE — Addendum Note (Signed)
Addended by: Meredith Staggers R on: 02/26/2023 12:08 PM   Modules accepted: Orders

## 2023-02-27 NOTE — Telephone Encounter (Signed)
Do you happen to know what office this is? I am unfamiliar

## 2023-02-27 NOTE — Telephone Encounter (Signed)
I will call and clarify specific name with patient initially has a preference but it sounds like she is referring to the [facility] on [address].

## 2023-05-01 ENCOUNTER — Encounter: Payer: Self-pay | Admitting: Family Medicine

## 2023-05-01 ENCOUNTER — Ambulatory Visit (INDEPENDENT_AMBULATORY_CARE_PROVIDER_SITE_OTHER): Payer: Medicare HMO | Admitting: Family Medicine

## 2023-05-01 VITALS — BP 128/72 | HR 68 | Temp 97.9°F | Ht 61.0 in | Wt 166.0 lb

## 2023-05-01 DIAGNOSIS — R42 Dizziness and giddiness: Secondary | ICD-10-CM | POA: Diagnosis not present

## 2023-05-01 DIAGNOSIS — E785 Hyperlipidemia, unspecified: Secondary | ICD-10-CM

## 2023-05-01 DIAGNOSIS — I10 Essential (primary) hypertension: Secondary | ICD-10-CM

## 2023-05-01 LAB — CBC
HCT: 45.7 % (ref 36.0–46.0)
Hemoglobin: 14.9 g/dL (ref 12.0–15.0)
MCHC: 32.7 g/dL (ref 30.0–36.0)
MCV: 90.8 fl (ref 78.0–100.0)
Platelets: 277 10*3/uL (ref 150.0–400.0)
RBC: 5.03 Mil/uL (ref 3.87–5.11)
RDW: 14 % (ref 11.5–15.5)
WBC: 6.8 10*3/uL (ref 4.0–10.5)

## 2023-05-01 LAB — COMPREHENSIVE METABOLIC PANEL
ALT: 21 U/L (ref 0–35)
AST: 29 U/L (ref 0–37)
Albumin: 4.4 g/dL (ref 3.5–5.2)
Alkaline Phosphatase: 66 U/L (ref 39–117)
BUN: 16 mg/dL (ref 6–23)
CO2: 30 mEq/L (ref 19–32)
Calcium: 10.6 mg/dL — ABNORMAL HIGH (ref 8.4–10.5)
Chloride: 102 mEq/L (ref 96–112)
Creatinine, Ser: 0.84 mg/dL (ref 0.40–1.20)
GFR: 70.31 mL/min (ref >60.00)
Glucose, Bld: 84 mg/dL (ref 70–99)
Potassium: 4.4 mEq/L (ref 3.5–5.1)
Sodium: 139 mEq/L (ref 135–145)
Total Bilirubin: 0.5 mg/dL (ref 0.2–1.2)
Total Protein: 7.6 g/dL (ref 6.0–8.3)

## 2023-05-01 LAB — LIPID PANEL
Cholesterol: 180 mg/dL (ref 0–200)
HDL: 70.1 mg/dL (ref >39.00)
LDL Cholesterol: 85 mg/dL (ref 0–99)
NonHDL: 110.17
Total CHOL/HDL Ratio: 3
Triglycerides: 125 mg/dL (ref 0.0–149.0)
VLDL: 25 mg/dL (ref 0.0–40.0)

## 2023-05-01 MED ORDER — HYDROCHLOROTHIAZIDE 12.5 MG PO CAPS: 12.5000 mg | ORAL_CAPSULE | Freq: Every day | ORAL | 3 refills | Status: DC

## 2023-05-01 MED ORDER — ROSUVASTATIN CALCIUM 10 MG PO TABS: 10.0000 mg | ORAL_TABLET | Freq: Every day | ORAL | 3 refills | Status: DC

## 2023-05-01 NOTE — Progress Notes (Signed)
Subjective:  Patient ID: Andrea Morrow, female    DOB: Jan 08, 1951  Age: 72 y.o. MRN: [id number]  CC:  Chief Complaint  Patient presents with   Medical Management of Chronic Issues    Pt doing well, no concerns     HPI Andrea Morrow presents for follow up.   Hypertension: Hydrochlorothiazide 12.5 mg daily,  no new side effects.  Rare brief lightheadedness if standing quickly or from bending forward. No syncope/presyncope. No recent changes.  Home readings: 125/80 range.  BP Readings from Last 3 Encounters:  05/01/23 128/72  02/04/23 122/74  10/29/22 126/78   Lab Results  Component Value Date   CREATININE 0.87 10/29/2022   Prediabetes: Active with chasing grandkids. Some sodas at times.  Weight has improved.   Lab Results  Component Value Date   HGBA1C 6.4 10/29/2022   Wt Readings from Last 3 Encounters:  05/01/23 166 lb (75.3 kg)  02/04/23 168 lb (76.2 kg)  10/29/22 170 lb 6.4 oz (77.3 kg)   Hyperlipidemia: Crestor 10 mg daily, no new myalgias/side effects. Fasting today.  Lab Results  Component Value Date   CHOL 168 10/29/2022   HDL 66.40 10/29/2022   LDLCALC 83 10/29/2022   TRIG 93.0 10/29/2022   CHOLHDL 3 10/29/2022   Lab Results  Component Value Date   ALT 18 10/29/2022   AST 22 10/29/2022   ALKPHOS 64 10/29/2022   BILITOT 0.6 10/29/2022    GERD/laryngeal pharyngeal reflux Prior ENT eval, trigger avoidance effective without medication need.  Denies dysphagia, odynophagia.   Prior ear issue resolved from March - fine past 2 months - never heard form ENT, but doing well.     History Patient Active Problem List   Diagnosis Date Noted   Prediabetes 10/29/2022   Hyperlipidemia 03/30/2016   Essential hypertension 03/30/2016   Other screening mammogram 01/28/2014   Past Medical History:  Diagnosis Date   Hyperlipidemia    Hypertension    Past Surgical History:  Procedure Laterality Date   TUBAL LIGATION     Allergies  Allergen Reactions    Poison Ivy Extract Rash   Prior to Admission medications   Medication Sig Start Date End Date Taking? Authorizing Provider  Ascorbic Acid (VITAMIN C) 1000 MG tablet Take 1,000 mg by mouth daily.   Yes [provider]  cholecalciferol (VITAMIN D3) 25 MCG (1000 UNIT) tablet Take 1,000 Units by mouth daily.   Yes [provider]  Ferrous Gluconate-C-Folic Acid (IRON-C PO) Take by mouth.   Yes [provider]  hydrochlorothiazide (MICROZIDE) 12.5 MG capsule Take 1 capsule (12.5 mg total) by mouth daily. 10/29/22  Yes Shade Flood, MD  Multiple Vitamins-Calcium (ONE-A-DAY WOMENS PO) Take by mouth.   Yes [provider]  rosuvastatin (CRESTOR) 10 MG tablet Take 1 tablet (10 mg total) by mouth daily. 10/29/22  Yes Shade Flood, MD  zinc gluconate 50 MG tablet Take 50 mg by mouth daily.   Yes [provider]  amoxicillin-clavulanate (AUGMENTIN) 875-125 MG tablet Take 1 tablet by mouth 2 (two) times daily. 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   Social Determinants of Health   Financial Resource Strain: Low Risk  (12/13/2022)   Overall Financial Resource Strain (CARDIA)    Difficulty of Paying Living Expenses: Not very hard  Food Insecurity: No Food Insecurity (12/13/2022)   Hunger Vital Sign    Worried About Running Out of Food in the Last Year: Never true    Ran Out of  Food in the Last Year: Never true  Transportation Needs: No Transportation Needs (12/13/2022)   PRAPARE - Administrator, Civil Service (Medical): No    Lack of Transportation (Non-Medical): No  Physical Activity: Not on file  Stress: Not on file  Social Connections: Moderately Integrated (12/13/2022)   Social Connection and Isolation Panel [NHANES]    Frequency of Communication with Friends and Family: Three times a week    Frequency of Social Gatherings with Friends and Family: Once a week    Attends Religious Services: More than 4 times per year    Active Member of [employer] or Organizations: No    Attends Banker Meetings: Never    Marital Status: Married  Catering manager Violence: Not At Risk (12/13/2022)   Humiliation, Afraid, Rape, and Kick questionnaire    Fear of Current or Ex-Partner: No    Emotionally Abused: No    Physically Abused: No    Sexually Abused: No    Review of Systems  Constitutional:  Negative for fatigue and unexpected weight change.  Respiratory:  Negative for chest tightness and shortness of breath.   Cardiovascular:  Negative for chest pain, palpitations and leg swelling.  Gastrointestinal:  Negative for abdominal pain and blood in stool.  Neurological:  Positive for dizziness (minimal with position change as above.). Negative for syncope, light-headedness and headaches.  All other systems reviewed and are negative.    Objective:   Vitals:   05/01/23 1056  BP: 128/72  Pulse: 68  Temp: 97.9 F (36.6 C)  TempSrc: Temporal  SpO2: 96%  Weight: 166 lb (75.3 kg)  Height: 5\' 1"  (1.549 m)     Physical Exam HENT:     Right Ear: Tympanic membrane and ear canal normal.     Left Ear: Tympanic membrane and ear canal normal.        Assessment & Plan:  Andrea Morrow is a 72 y.o. female . Episodic lightheadedness - Plan: CBC  -Minimal symptoms as above with position changes, possible orthostasis component.  Check CBC but  minimal, fleeting symptoms, no recent worsening and blood pressure stable in office.  No med changes for now.  Maintenance of hydration, water is best.  RTC precautions.  Essential hypertension - Plan: hydrochlorothiazide (MICROZIDE) 12.5 MG capsule  -Stable on current regimen, no med changes for now.  Consider adjustment in regimen if persistent dizziness/orthostasis symptoms.  See above  Hyperlipidemia, unspecified hyperlipidemia type - Plan: rosuvastatin (CRESTOR) 10 MG tablet, Comprehensive metabolic panel, Lipid panel  -Tolerating statin, check labs, continue same regimen for now with adjustment of plan depending on results  Meds ordered this encounter  Medications   hydrochlorothiazide (MICROZIDE) 12.5 MG capsule    Sig: Take 1 capsule (12.5  mg total) by mouth daily.    Dispense:  90 capsule    Refill:  3   rosuvastatin (CRESTOR) 10 MG tablet    Sig: Take 1 tablet (10 mg total) by mouth daily.    Dispense:  90 tablet    Refill:  3   Patient Instructions  No med changes at this time.  Make sure to drink plenty of fluids throughout the day, water is best.  If any persistent lightheadedness with standing or bending forward we can look at medication changes or look at other labs if needed.  I will check a blood count today.  Follow-up if those symptoms worsen otherwise recheck in 6 months. Take care!    Signed,   Meredith Staggers, MD [facility], [facility] 05/01/23 11:34 AM

## 2023-05-01 NOTE — Patient Instructions (Addendum)
No med changes at this time.  Make sure to drink plenty of fluids throughout the day, water is best.  If any persistent lightheadedness with standing or bending forward we can look at medication changes or look at other labs if needed.  I will check a blood count today.  Follow-up if those symptoms worsen otherwise recheck in 6 months. Take care!

## 2023-05-15 ENCOUNTER — Ambulatory Visit: Admission: RE | Admit: 2023-05-15 | Payer: Medicare HMO | Source: Ambulatory Visit

## 2023-05-15 DIAGNOSIS — E349 Endocrine disorder, unspecified: Secondary | ICD-10-CM | POA: Diagnosis not present

## 2023-05-15 DIAGNOSIS — N958 Other specified menopausal and perimenopausal disorders: Secondary | ICD-10-CM | POA: Diagnosis not present

## 2023-05-15 DIAGNOSIS — E2839 Other primary ovarian failure: Secondary | ICD-10-CM

## 2023-05-21 ENCOUNTER — Encounter: Payer: Self-pay | Admitting: Family Medicine

## 2023-05-28 ENCOUNTER — Telehealth: Payer: Self-pay

## 2023-05-28 NOTE — Telephone Encounter (Signed)
-----   Message from Shade Flood sent at 05/28/2023  1:42 PM EDT ----- Normal bone density letter.

## 2023-06-05 ENCOUNTER — Ambulatory Visit (INDEPENDENT_AMBULATORY_CARE_PROVIDER_SITE_OTHER): Payer: Medicare HMO

## 2023-06-05 DIAGNOSIS — Z23 Encounter for immunization: Secondary | ICD-10-CM | POA: Diagnosis not present

## 2023-06-05 NOTE — Progress Notes (Signed)
Pt received flu vaccine while in office with spouse

## 2023-07-09 DIAGNOSIS — H269 Unspecified cataract: Secondary | ICD-10-CM | POA: Diagnosis not present

## 2023-11-04 ENCOUNTER — Ambulatory Visit: Payer: Medicare HMO | Admitting: Family Medicine

## 2023-11-04 ENCOUNTER — Other Ambulatory Visit: Payer: Self-pay | Admitting: Family Medicine

## 2023-11-04 ENCOUNTER — Encounter: Payer: Self-pay | Admitting: Family Medicine

## 2023-11-04 VITALS — BP 128/60 | HR 65 | Temp 98.6°F | Ht 61.25 in | Wt 162.4 lb

## 2023-11-04 DIAGNOSIS — E785 Hyperlipidemia, unspecified: Secondary | ICD-10-CM | POA: Diagnosis not present

## 2023-11-04 DIAGNOSIS — J040 Acute laryngitis: Secondary | ICD-10-CM | POA: Diagnosis not present

## 2023-11-04 DIAGNOSIS — R7303 Prediabetes: Secondary | ICD-10-CM | POA: Diagnosis not present

## 2023-11-04 DIAGNOSIS — R052 Subacute cough: Secondary | ICD-10-CM

## 2023-11-04 DIAGNOSIS — Z1329 Encounter for screening for other suspected endocrine disorder: Secondary | ICD-10-CM

## 2023-11-04 DIAGNOSIS — I10 Essential (primary) hypertension: Secondary | ICD-10-CM

## 2023-11-04 DIAGNOSIS — Z1231 Encounter for screening mammogram for malignant neoplasm of breast: Secondary | ICD-10-CM

## 2023-11-04 DIAGNOSIS — Z0001 Encounter for general adult medical examination with abnormal findings: Secondary | ICD-10-CM | POA: Diagnosis not present

## 2023-11-04 DIAGNOSIS — Z0000 Encounter for general adult medical examination without abnormal findings: Secondary | ICD-10-CM

## 2023-11-04 LAB — TSH: TSH: 9.66 u[IU]/mL — ABNORMAL HIGH (ref 0.35–5.50)

## 2023-11-04 LAB — COMPREHENSIVE METABOLIC PANEL
ALT: 23 U/L (ref 0–35)
AST: 32 U/L (ref 0–37)
Albumin: 4.3 g/dL (ref 3.5–5.2)
Alkaline Phosphatase: 77 U/L (ref 39–117)
BUN: 15 mg/dL (ref 6–23)
CO2: 30 meq/L (ref 19–32)
Calcium: 10.1 mg/dL (ref 8.4–10.5)
Chloride: 102 meq/L (ref 96–112)
Creatinine, Ser: 0.76 mg/dL (ref 0.40–1.20)
GFR: 79 mL/min (ref >60.00)
Glucose, Bld: 86 mg/dL (ref 70–99)
Potassium: 4.4 meq/L (ref 3.5–5.1)
Sodium: 140 meq/L (ref 135–145)
Total Bilirubin: 0.4 mg/dL (ref 0.2–1.2)
Total Protein: 7.5 g/dL (ref 6.0–8.3)

## 2023-11-04 LAB — LIPID PANEL
Cholesterol: 174 mg/dL (ref 0–200)
HDL: 68.6 mg/dL (ref >39.00)
LDL Cholesterol: 87 mg/dL (ref 0–99)
NonHDL: 105.46
Total CHOL/HDL Ratio: 3
Triglycerides: 93 mg/dL (ref 0.0–149.0)
VLDL: 18.6 mg/dL (ref 0.0–40.0)

## 2023-11-04 LAB — HEMOGLOBIN A1C: Hgb A1c MFr Bld: 6.3 % (ref 4.6–6.5)

## 2023-11-04 MED ORDER — BENZONATATE 100 MG PO CAPS: 100.0000 mg | ORAL_CAPSULE | Freq: Three times a day (TID) | ORAL | 0 refills | Status: DC | PRN

## 2023-11-04 MED ORDER — AZITHROMYCIN 250 MG PO TABS: ORAL_TABLET | ORAL | 0 refills | Status: AC

## 2023-11-04 NOTE — Patient Instructions (Signed)
 Glad to hear that the voice is improving.  Cough should also improve this week as likely due to a virus.  If it does not improve, I did write for an antibiotic but I would not start that unless cough is not improving throughout this week.  Tessalon  Perles can be used for cough if needed throughout the day.  Let me know if any new or worsening symptoms.  Health Maintenance After Age 72 After age 76, you are at a higher risk for certain long-term diseases and infections as well as injuries from falls. Falls are a major cause of broken bones and head injuries in people who are older than age 28. Getting regular preventive care can help to keep you healthy and well. Preventive care includes getting regular testing and making lifestyle changes as recommended by your health care provider. Talk with your health care provider about: Which screenings and tests you should have. A screening is a test that checks for a disease when you have no symptoms. A diet and exercise plan that is right for you. What should I know about screenings and tests to prevent falls? Screening and testing are the best ways to find a health problem early. Early diagnosis and treatment give you the best chance of managing medical conditions that are common after age 45. Certain conditions and lifestyle choices may make you more likely to have a fall. Your health care provider may recommend: Regular vision checks. Poor vision and conditions such as cataracts can make you more likely to have a fall. If you wear glasses, make sure to get your prescription updated if your vision changes. Medicine review. Work with your health care provider to regularly review all of the medicines you are taking, including over-the-counter medicines. Ask your health care provider about any side effects that may make you more likely to have a fall. Tell your health care provider if any medicines that you take make you feel dizzy or sleepy. Strength and balance  checks. Your health care provider may recommend certain tests to check your strength and balance while standing, walking, or changing positions. Foot health exam. Foot pain and numbness, as well as not wearing proper footwear, can make you more likely to have a fall. Screenings, including: Osteoporosis screening. Osteoporosis is a condition that causes the bones to get weaker and break more easily. Blood pressure screening. Blood pressure changes and medicines to control blood pressure can make you feel dizzy. Depression screening. You may be more likely to have a fall if you have a fear of falling, feel depressed, or feel unable to do activities that you used to do. Alcohol use screening. Using too much alcohol can affect your balance and may make you more likely to have a fall. Follow these instructions at home: Lifestyle Do not drink alcohol if: Your health care provider tells you not to drink. If you drink alcohol: Limit how much you have to: 0-1 drink a day for women. 0-2 drinks a day for men. Know how much alcohol is in your drink. In the [place] one drink equals one 12 oz bottle of beer (355 mL), one 5 oz glass of wine (148 mL), or one 1 oz glass of hard liquor (44 mL). Do not use any products that contain nicotine or tobacco. These products include cigarettes, chewing tobacco, and vaping devices, such as e-cigarettes. If you need help quitting, ask your health care provider. Activity  Follow a regular exercise program to stay fit. This will  help you maintain your balance. Ask your health care provider what types of exercise are appropriate for you. If you need a cane or walker, use it as recommended by your health care provider. Wear supportive shoes that have nonskid soles. Safety  Remove any tripping hazards, such as rugs, cords, and clutter. Install safety equipment such as grab bars in bathrooms and safety rails on stairs. Keep rooms and walkways well-lit. General  instructions Talk with your health care provider about your risks for falling. Tell your health care provider if: You fall. Be sure to tell your health care provider about all falls, even ones that seem minor. You feel dizzy, tiredness (fatigue), or off-balance. Take over-the-counter and prescription medicines only as told by your health care provider. These include supplements. Eat a healthy diet and maintain a healthy weight. A healthy diet includes low-fat dairy products, low-fat (lean) meats, and fiber from whole grains, beans, and lots of fruits and vegetables. Stay current with your vaccines. Schedule regular health, dental, and eye exams. Summary Having a healthy lifestyle and getting preventive care can help to protect your health and wellness after age 105. Screening and testing are the best way to find a health problem early and help you avoid having a fall. Early diagnosis and treatment give you the best chance for managing medical conditions that are more common for people who are older than age 77. Falls are a major cause of broken bones and head injuries in people who are older than age 78. Take precautions to prevent a fall at home. Work with your health care provider to learn what changes you can make to improve your health and wellness and to prevent falls. This information is not intended to replace advice given to you by your health care provider. Make sure you discuss any questions you have with your health care provider. Document Revised: 01/30/2021 Document Reviewed: 01/30/2021 Else[employer] Patient Education  2024 [employer]   Cough, Adult Coughing is a reflex that clears your throat and airways (respiratory system). It helps heal and protect your lungs. It is normal to cough from time to time. A cough that happens with other symptoms or that lasts a long time may be a sign of a condition that needs treatment. A short-term (acute) cough may only last 2-3 weeks. A long-term  (chronic) cough may last 8 or more weeks. Coughing is often caused by: Diseases, such as: An infection of the respiratory system. Asthma or other heart or lung diseases. Gastroesophageal reflux. This is when acid comes back up from the stomach. Breathing in things that irritate your lungs. Allergies. Postnasal drip. This is when mucus runs down the back of your throat. Smoking. Some medicines. Follow these instructions at home: Medicines Take over-the-counter and prescription medicines only as told by your health care provider. Talk with your provider before you take cough medicine (cough suppressants). Eating and drinking Do not drink alcohol. Avoid caffeine. Drink enough fluid to keep your pee (urine) pale yellow. Lifestyle Avoid cigarette smoke. Do not use any products that contain nicotine or tobacco. These products include cigarettes, chewing tobacco, and vaping devices, such as e-cigarettes. If you need help quitting, ask your provider. Avoid things that make you cough. These may include perfumes, candles, cleaning products, or campfire smoke. General instructions  Watch for any changes to your cough. Tell your provider about them. Always cover your mouth when you cough. If the air is dry in your bedroom or home, use a cool mist  vaporizer or humidifier. If your cough is worse at night, try to sleep in a semi-upright position. Rest as needed. Contact a health care provider if: You have new symptoms, or your symptoms get worse. You cough up pus. You have a fever that does not go away or a cough that does not get better after 2-3 weeks. You cannot control your cough with medicine, and you are losing sleep. You have pain that gets worse or is not helped with medicine. You lose weight for no clear reason. You have night sweats. Get help right away if: You cough up blood. You have trouble breathing. Your heart is beating very fast. These symptoms may be an emergency. Get  help right away. Call 911. Do not wait to see if the symptoms will go away. Do not drive yourself to the hospital. This information is not intended to replace advice given to you by your health care provider. Make sure you discuss any questions you have with your health care provider. Document Revised: 05/11/2022 Document Reviewed: 05/11/2022 Elsevier Patient Education  2024 [employer]  Laryngitis  Laryngitis is inflammation of the vocal cords that causes symptoms such as hoarseness or loss of voice. The vocal cords are two bands of muscles in your throat. When you speak, these cords come together and vibrate. The vibrations come out through your mouth as sound. When your vocal cords are inflamed, your voice sounds different. Laryngitis can be temporary (acute) or long-term (chronic). Most cases of acute laryngitis improve with time. Chronic laryngitis is laryngitis that lasts for more than 3 weeks. What are the causes? Acute laryngitis may be caused by: A viral infection. Lots of talking, yelling, or singing. This is also called vocal strain. A bacterial infection. Chronic laryngitis may be caused by: Vocal strain or an injury to the vocal cords. Acid reflux (gastroesophageal reflux disease, or GERD). Allergies, a sinus infection, or postnasal drip. Smoking. Excessive alcohol use. Breathing in chemicals or dust. Growths on the vocal cords. What increases the risk? The following factors may make you more likely to develop this condition: Smoking. Alcohol abuse. Having allergies. Chronic irritants in the workplace, such as toxic fumes. What are the signs or symptoms? Symptoms of this condition may include: Low, hoarse voice. Loss of voice. Dry cough. Sore or dry throat. Stuffy or congested nose. How is this diagnosed? This condition may be diagnosed based on: Your symptoms and a physical exam. Throat culture. Blood test. A procedure in which your health care provider  looks at your vocal cords with a mirror or viewing tube (laryngoscopy). How is this treated? Treatment for laryngitis depends on what is causing it. Usually, treatment involves resting your voice and using medicines to soothe your throat. If your laryngitis is caused by a bacterial infection, you may need to take antibiotic medicine. If your laryngitis is caused by a growth, you may need to have a procedure to remove it. Follow these instructions at home: Medicines Take over-the-counter and prescription medicines only as told by your health care provider. If you were prescribed an antibiotic medicine, take it as told by your health care provider. Do not stop taking the antibiotic even if you start to feel better. Use throat lozenges or sprays to soothe your throat as told by your health care provider. General instructions  Talk as little as possible. To do this: Write instead of talking. Do this until your voice is back to normal. Avoid whispering, which can cause vocal strain. Gargle with  a mixture of salt and water 3-4 times a day or as needed. To make salt water, completely dissolve -1 tsp (3-6 g) of salt in 1 cup (237 mL) of warm water. Drink enough fluid to keep your urine pale yellow. Breathe in moist air. Use a humidifier if you live in a dry climate. Do not use any products that contain nicotine or tobacco. These products include cigarettes, chewing tobacco, and vaping devices, such as e-cigarettes. If you need help quitting, ask your health care provider. Contact a health care provider if: You have a fever. You have increasing pain. Your symptoms do not get better in 2 weeks. Get help right away if: You cough up blood. You have difficulty swallowing. You have trouble breathing. Summary Laryngitis is inflammation of the vocal cords that causes symptoms such as hoarseness or loss of voice. Laryngitis can be temporary or long-term. Treatment for laryngitis depends on the cause.  It often involves resting your voice and using medicine to soothe your throat. Get help right away if you have difficulty swallowing or breathing or if you cough up blood. This information is not intended to replace advice given to you by your health care provider. Make sure you discuss any questions you have with your health care provider. Document Revised: 11/28/2020 Document Reviewed: 11/28/2020 Else[employer] Patient Education  2024 [employer].

## 2023-11-04 NOTE — Progress Notes (Signed)
 Subjective:  Patient ID: Andrea Morrow, female    DOB: May 18, 1951  Age: 73 y.o. MRN: [id number]  CC:  Chief Complaint  Patient presents with   Laryngitis    Pt notes cough a few weeks ago and went away but then Wed last week wed came back and notes now lost her voice, no other symptoms, feels well just cough     HPI Andrea Morrow presents for Annual Exam Andrea Morrow presents for physical and acute concern as above.   Cough, voice loss.  As above, few weeks ago had cough that resolved, then new cough with loss of voice last week. Denies fever.  Denies dyspnea.  Eating and drinking okay.feeling ok otherwise. Voice is better. Cough some better at times, then some coughing fits. Nonproductive cough. No syncope/presyncope, no post-tussive emesis. Spouse with similar sx's.   Tx: dayquil, nyquil.  Sleeping ok.    Hypertension: Treated with hydrochlorothiazide  12.5 mg daily, episodic lightheadedness discussed in August.  Possible orthostatic component at that time. No recent lightheadedness.  Home readings: not recent.  BP Readings from Last 3 Encounters:  11/04/23 128/60  05/01/23 128/72  02/04/23 122/74   Lab Results  Component Value Date   CREATININE 0.84 05/01/2023   Prediabetes: Diet/exercise approach and weight has improved by 4 pounds since August. No changes.  Lab Results  Component Value Date   HGBA1C 6.4 10/29/2022   Wt Readings from Last 3 Encounters:  11/04/23 162 lb 6.4 oz (73.7 kg)  05/01/23 166 lb (75.3 kg)  02/04/23 168 lb (76.2 kg)   Hyperlipidemia: Treated with Crestor  10 mg daily without any side effects or myalgias.  Lab Results  Component Value Date   CHOL 180 05/01/2023   HDL 70.10 05/01/2023   LDLCALC 85 05/01/2023   TRIG 125.0 05/01/2023   CHOLHDL 3 05/01/2023   Lab Results  Component Value Date   ALT 21 05/01/2023   AST 29 05/01/2023   ALKPHOS 66 05/01/2023   BILITOT 0.5 05/01/2023           11/04/2023   10:08 AM 05/01/2023    10:54 AM 12/13/2022   10:36 AM 10/29/2022    9:18 AM 02/05/2022    1:23 PM  Depression screen PHQ 2/9  Decreased Interest 0 0 0 0 0  Down, Depressed, Hopeless 0 0 0 0 0  PHQ - 2 Score 0 0 0 0 0  Altered sleeping 0 0 1    Tired, decreased energy 0 0 0    Change in appetite 0 0 0    Feeling bad or failure about yourself  0 0 0    Trouble concentrating 0 0 0    Moving slowly or fidgety/restless 0 0 0    Suicidal thoughts 0 0 0    PHQ-9 Score 0 0 1    Difficult doing work/chores   Not difficult at all      Health Maintenance  Topic Date Due   COVID-19 Vaccine (1 - 2024-25 season) Never done   Medicare Annual Wellness (AWV)  12/13/2023   DTaP/Tdap/Td (2 - Td or Tdap) 09/27/2024   MAMMOGRAM  11/25/2024   Colonoscopy  10/08/2027   Pneumonia Vaccine 8+ Years old  Completed   INFLUENZA VACCINE  Completed   DEXA SCAN  Completed   Hepatitis C Screening  Completed   Zoster Vaccines- Shingrix  Completed   HPV VACCINES  Aged Out  Colonoscopy October 07, 2017.  Repeat 10 years. Bone density  May 15, 2023.Normal, continued on vitamin D and calcium  supplementation.  Mammogram March 2024 - will be scheduling.  Immunization History  Administered Date(s) Administered   Fluad Quad(high Dose 65+) 09/10/2019, 08/22/2020, 06/08/2022   Fluad Trivalent(High Dose 65+) 06/05/2023   Influenza,inj,Quad PF,6+ Mos 08/27/2017   Pneumococcal Conjugate-13 02/25/2018   Pneumococcal Polysaccharide-23 02/27/2019   Tdap 09/27/2014   Zoster Recombinant(Shingrix) 04/08/2018, 07/02/2018  Up-to-date on vaccines as above  Covid booster - declined.   No results found. Wears glasses. Appt with optho few months ago - early cataracts.   Dental: appt this month, usually every 6 months.   Alcohol: none  Tobacco: none  Exercise:active with watching grandchildren and walking. Plans on [employer] for silver sneakers.    History Patient Active Problem List   Diagnosis Date Noted   Prediabetes 10/29/2022    Hyperlipidemia 03/30/2016   Essential hypertension 03/30/2016   Other screening mammogram 01/28/2014   Past Medical History:  Diagnosis Date   Hyperlipidemia    Hypertension    Past Surgical History:  Procedure Laterality Date   TUBAL LIGATION     Allergies  Allergen Reactions   Poison Ivy Extract Rash   Prior to Admission medications   Medication Sig Start Date End Date Taking? Authorizing Provider  Ascorbic Acid (VITAMIN C) 1000 MG tablet Take 1,000 mg by mouth daily.   Yes [provider]  benzonatate  (TESSALON ) 100 MG capsule Take 1 capsule (100 mg total) by mouth 3 (three) times daily as needed for cough. 11/04/23  Yes Benjiman Bras, MD  cholecalciferol (VITAMIN D3) 25 MCG (1000 UNIT) tablet Take 1,000 Units by mouth daily.   Yes [provider]  Ferrous Gluconate-C-Folic Acid (IRON-C PO) Take by mouth.   Yes [provider]  hydrochlorothiazide  (MICROZIDE ) 12.5 MG capsule Take 1 capsule (12.5 mg total) by mouth daily. 05/01/23  Yes Benjiman Bras, MD  Multiple Vitamins-Calcium  (ONE-A-DAY WOMENS PO) Take by mouth.   Yes [provider]  rosuvastatin  (CRESTOR ) 10 MG tablet Take 1 tablet (10 mg total) by mouth daily. 05/01/23  Yes Benjiman Bras, MD  zinc gluconate 50 MG tablet Take 50 mg by mouth daily.   Yes [provider]   Social History   Socioeconomic History   Marital status: Married    Spouse name: Not on file   Number of children: Not on file   Years of education: Not on file   Highest education level: Not on file  Occupational History   Not on file  Tobacco Use   Smoking status: Never   Smokeless tobacco: Never  Vaping Use   Vaping status: Never Used  Substance and Sexual Activity   Alcohol use: No   Drug use: No   Sexual activity: Not Currently  Other Topics Concern   Not on file  Social History Narrative   Married   Barrister's clerk   Social Drivers of Health   Financial Resource Strain: Low Risk   (12/13/2022)   Overall Financial Resource Strain (CARDIA)    Difficulty of Paying Living Expenses: Not very hard  Food Insecurity: No Food Insecurity (12/13/2022)   Hunger Vital Sign    Worried About Running Out of Food in the Last Year: Never true    Ran Out of Food in the Last Year: Never true  Transportation Needs: No Transportation Needs (12/13/2022)   PRAPARE - Administrator, Civil Service (Medical): No    Lack of Transportation (Non-Medical): No  Physical Activity: Not on file  Stress: Not on file  Social Connections: Moderately Integrated (12/13/2022)   Social Connection and Isolation Panel [NHANES]    Frequency of Communication with Friends and Family: Three times a week    Frequency of Social Gatherings with Friends and Family: Once a week    Attends Religious Services: More than 4 times per year    Active Member of [employer] or Organizations: No    Attends Banker Meetings: Never    Marital Status: Married  Catering manager Violence: Not At Risk (12/13/2022)   Humiliation, Afraid, Rape, and Kick questionnaire    Fear of Current or Ex-Partner: No    Emotionally Abused: No    Physically Abused: No    Sexually Abused: No    Review of Systems  13 point review of systems per patient health survey noted.  Negative other than as indicated above or in HPI.   Objective:   Vitals:   11/04/23 1006  BP: 128/60  Pulse: 65  Temp: 98.6 F (37 C)  TempSrc: Temporal  SpO2: 98%  Weight: 162 lb 6.4 oz (73.7 kg)  Height: 5' 1.25" (1.556 m)     Physical Exam Vitals reviewed.  Constitutional:      General: She is not in acute distress.    Appearance: Normal appearance. She is well-developed.  HENT:     Head: Normocephalic and atraumatic.     Right Ear: Hearing, tympanic membrane, ear canal and external ear normal.     Left Ear: Hearing, tympanic membrane, ear canal and external ear normal.     Nose: Nose normal.     Mouth/Throat:     Pharynx: No  posterior oropharyngeal erythema.  Eyes:     Conjunctiva/sclera: Conjunctivae normal.     Pupils: Pupils are equal, round, and reactive to light.  Neck:     Thyroid : No thyromegaly.  Cardiovascular:     Rate and Rhythm: Normal rate and regular rhythm.     Heart sounds: Normal heart sounds. No murmur heard. Pulmonary:     Effort: Pulmonary effort is normal. No respiratory distress.     Breath sounds: Normal breath sounds. No wheezing or rhonchi.  Abdominal:     General: Bowel sounds are normal.     Palpations: Abdomen is soft.     Tenderness: There is no abdominal tenderness.  Musculoskeletal:        General: No tenderness. Normal range of motion.     Cervical back: Normal range of motion and neck supple.  Lymphadenopathy:     Cervical: No cervical adenopathy.  Skin:    General: Skin is warm and dry.     Findings: No rash.  Neurological:     Mental Status: She is alert and oriented to person, place, and time.  Psychiatric:        Mood and Affect: Mood normal.        Behavior: Behavior normal.        Thought Content: Thought content normal.       Assessment & Plan:  Andrea Morrow is a 73 y.o. female . Annual physical exam  - -anticipatory guidance as below in AVS, screening labs above. Health maintenance items as above in HPI discussed/recommended as applicable.   Hyperlipidemia, unspecified hyperlipidemia type - Plan: Comprehensive metabolic panel, Lipid panel  -  Stable, tolerating current regimen. Medications with refills. Labs pending as above.   Essential hypertension - Plan: TSH, Comprehensive metabolic panel  -  Stable, tolerating current regimen. Medications with refils.  Labs pending as above.   Prediabetes - Plan: Hemoglobin A1c  -Weight improved, check A1c and adjust plan accordingly  Screening for thyroid  disorder  -Has history of hypertension, no recent TSH, check as above.  Subacute cough - Plan: azithromycin  (ZITHROMAX ) 250 MG  tablet Laryngitis  -Suspected viral illness.  Previous cough a few weeks ago had improved, new cough as of last week with laryngitis, voice improving.  Persistent cough but reassuring exam with lungs clear, reassuring vitals.  Continue symptomatic care recommended at this time, Tessalon  Perles prescribed.  Fluids, voice rest.  If cough not improving within the next week or worsening coughing fits printed antibiotic azithromycin  they can be started with potential side effects and risks discussed.  Meds ordered this encounter  Medications   benzonatate  (TESSALON ) 100 MG capsule    Sig: Take 1 capsule (100 mg total) by mouth 3 (three) times daily as needed for cough.    Dispense:  20 capsule    Refill:  0   azithromycin  (ZITHROMAX ) 250 MG tablet    Sig: Take 2 tablets on day 1, then 1 tablet daily on days 2 through 5    Dispense:  6 tablet    Refill:  0   Patient Instructions  Glad to hear that the voice is improving.  Cough should also improve this week as likely due to a virus.  If it does not improve, I did write for an antibiotic but I would not start that unless cough is not improving throughout this week.  Tessalon  Perles can be used for cough if needed throughout the day.  Let me know if any new or worsening symptoms.  Health Maintenance After Age 33 After age 63, you are at a higher risk for certain long-term diseases and infections as well as injuries from falls. Falls are a major cause of broken bones and head injuries in people who are older than age 78. Getting regular preventive care can help to keep you healthy and well. Preventive care includes getting regular testing and making lifestyle changes as recommended by your health care provider. Talk with your health care provider about: Which screenings and tests you should have. A screening is a test that checks for a disease when you have no symptoms. A diet and exercise plan that is right for you. What should I know about screenings  and tests to prevent falls? Screening and testing are the best ways to find a health problem early. Early diagnosis and treatment give you the best chance of managing medical conditions that are common after age 66. Certain conditions and lifestyle choices may make you more likely to have a fall. Your health care provider may recommend: Regular vision checks. Poor vision and conditions such as cataracts can make you more likely to have a fall. If you wear glasses, make sure to get your prescription updated if your vision changes. Medicine review. Work with your health care provider to regularly review all of the medicines you are taking, including over-the-counter medicines. Ask your health care provider about any side effects that may make you more likely to have a fall. Tell your health care provider if any medicines that you take make you feel dizzy or sleepy. Strength and balance checks. Your health care provider may recommend certain tests to check your strength and balance while standing, walking, or changing positions. Foot health exam. Foot pain and numbness, as well as not wearing proper footwear,  can make you more likely to have a fall. Screenings, including: Osteoporosis screening. Osteoporosis is a condition that causes the bones to get weaker and break more easily. Blood pressure screening. Blood pressure changes and medicines to control blood pressure can make you feel dizzy. Depression screening. You may be more likely to have a fall if you have a fear of falling, feel depressed, or feel unable to do activities that you used to do. Alcohol use screening. Using too much alcohol can affect your balance and may make you more likely to have a fall. Follow these instructions at home: Lifestyle Do not drink alcohol if: Your health care provider tells you not to drink. If you drink alcohol: Limit how much you have to: 0-1 drink a day for women. 0-2 drinks a day for men. Know how much  alcohol is in your drink. In the [place] one drink equals one 12 oz bottle of beer (355 mL), one 5 oz glass of wine (148 mL), or one 1 oz glass of hard liquor (44 mL). Do not use any products that contain nicotine or tobacco. These products include cigarettes, chewing tobacco, and vaping devices, such as e-cigarettes. If you need help quitting, ask your health care provider. Activity  Follow a regular exercise program to stay fit. This will help you maintain your balance. Ask your health care provider what types of exercise are appropriate for you. If you need a cane or walker, use it as recommended by your health care provider. Wear supportive shoes that have nonskid soles. Safety  Remove any tripping hazards, such as rugs, cords, and clutter. Install safety equipment such as grab bars in bathrooms and safety rails on stairs. Keep rooms and walkways well-lit. General instructions Talk with your health care provider about your risks for falling. Tell your health care provider if: You fall. Be sure to tell your health care provider about all falls, even ones that seem minor. You feel dizzy, tiredness (fatigue), or off-balance. Take over-the-counter and prescription medicines only as told by your health care provider. These include supplements. Eat a healthy diet and maintain a healthy weight. A healthy diet includes low-fat dairy products, low-fat (lean) meats, and fiber from whole grains, beans, and lots of fruits and vegetables. Stay current with your vaccines. Schedule regular health, dental, and eye exams. Summary Having a healthy lifestyle and getting preventive care can help to protect your health and wellness after age 37. Screening and testing are the best way to find a health problem early and help you avoid having a fall. Early diagnosis and treatment give you the best chance for managing medical conditions that are more common for people who are older than age 48. Falls are a major  cause of broken bones and head injuries in people who are older than age 4. Take precautions to prevent a fall at home. Work with your health care provider to learn what changes you can make to improve your health and wellness and to prevent falls. This information is not intended to replace advice given to you by your health care provider. Make sure you discuss any questions you have with your health care provider. Document Revised: 01/30/2021 Document Reviewed: 01/30/2021 Elsevier Patient Education  2024 Elsevier Inc.   Cough, Adult Coughing is a reflex that clears your throat and airways (respiratory system). It helps heal and protect your lungs. It is normal to cough from time to time. A cough that happens with other symptoms or that lasts a  long time may be a sign of a condition that needs treatment. A short-term (acute) cough may only last 2-3 weeks. A long-term (chronic) cough may last 8 or more weeks. Coughing is often caused by: Diseases, such as: An infection of the respiratory system. Asthma or other heart or lung diseases. Gastroesophageal reflux. This is when acid comes back up from the stomach. Breathing in things that irritate your lungs. Allergies. Postnasal drip. This is when mucus runs down the back of your throat. Smoking. Some medicines. Follow these instructions at home: Medicines Take over-the-counter and prescription medicines only as told by your health care provider. Talk with your provider before you take cough medicine (cough suppressants). Eating and drinking Do not drink alcohol. Avoid caffeine. Drink enough fluid to keep your pee (urine) pale yellow. Lifestyle Avoid cigarette smoke. Do not use any products that contain nicotine or tobacco. These products include cigarettes, chewing tobacco, and vaping devices, such as e-cigarettes. If you need help quitting, ask your provider. Avoid things that make you cough. These may include perfumes, candles, cleaning  products, or campfire smoke. General instructions  Watch for any changes to your cough. Tell your provider about them. Always cover your mouth when you cough. If the air is dry in your bedroom or home, use a cool mist vaporizer or humidifier. If your cough is worse at night, try to sleep in a semi-upright position. Rest as needed. Contact a health care provider if: You have new symptoms, or your symptoms get worse. You cough up pus. You have a fever that does not go away or a cough that does not get better after 2-3 weeks. You cannot control your cough with medicine, and you are losing sleep. You have pain that gets worse or is not helped with medicine. You lose weight for no clear reason. You have night sweats. Get help right away if: You cough up blood. You have trouble breathing. Your heart is beating very fast. These symptoms may be an emergency. Get help right away. Call 911. Do not wait to see if the symptoms will go away. Do not drive yourself to the hospital. This information is not intended to replace advice given to you by your health care provider. Make sure you discuss any questions you have with your health care provider. Document Revised: 05/11/2022 Document Reviewed: 05/11/2022 Elsevier Patient Education  2024 Elsevier Inc.  Laryngitis  Laryngitis is inflammation of the vocal cords that causes symptoms such as hoarseness or loss of voice. The vocal cords are two bands of muscles in your throat. When you speak, these cords come together and vibrate. The vibrations come out through your mouth as sound. When your vocal cords are inflamed, your voice sounds different. Laryngitis can be temporary (acute) or long-term (chronic). Most cases of acute laryngitis improve with time. Chronic laryngitis is laryngitis that lasts for more than 3 weeks. What are the causes? Acute laryngitis may be caused by: A viral infection. Lots of talking, yelling, or singing. This is also  called vocal strain. A bacterial infection. Chronic laryngitis may be caused by: Vocal strain or an injury to the vocal cords. Acid reflux (gastroesophageal reflux disease, or GERD). Allergies, a sinus infection, or postnasal drip. Smoking. Excessive alcohol use. Breathing in chemicals or dust. Growths on the vocal cords. What increases the risk? The following factors may make you more likely to develop this condition: Smoking. Alcohol abuse. Having allergies. Chronic irritants in the workplace, such as toxic fumes. What are the  signs or symptoms? Symptoms of this condition may include: Low, hoarse voice. Loss of voice. Dry cough. Sore or dry throat. Stuffy or congested nose. How is this diagnosed? This condition may be diagnosed based on: Your symptoms and a physical exam. Throat culture. Blood test. A procedure in which your health care provider looks at your vocal cords with a mirror or viewing tube (laryngoscopy). How is this treated? Treatment for laryngitis depends on what is causing it. Usually, treatment involves resting your voice and using medicines to soothe your throat. If your laryngitis is caused by a bacterial infection, you may need to take antibiotic medicine. If your laryngitis is caused by a growth, you may need to have a procedure to remove it. Follow these instructions at home: Medicines Take over-the-counter and prescription medicines only as told by your health care provider. If you were prescribed an antibiotic medicine, take it as told by your health care provider. Do not stop taking the antibiotic even if you start to feel better. Use throat lozenges or sprays to soothe your throat as told by your health care provider. General instructions  Talk as little as possible. To do this: Write instead of talking. Do this until your voice is back to normal. Avoid whispering, which can cause vocal strain. Gargle with a mixture of salt and water 3-4 times a  day or as needed. To make salt water, completely dissolve -1 tsp (3-6 g) of salt in 1 cup (237 mL) of warm water. Drink enough fluid to keep your urine pale yellow. Breathe in moist air. Use a humidifier if you live in a dry climate. Do not use any products that contain nicotine or tobacco. These products include cigarettes, chewing tobacco, and vaping devices, such as e-cigarettes. If you need help quitting, ask your health care provider. Contact a health care provider if: You have a fever. You have increasing pain. Your symptoms do not get better in 2 weeks. Get help right away if: You cough up blood. You have difficulty swallowing. You have trouble breathing. Summary Laryngitis is inflammation of the vocal cords that causes symptoms such as hoarseness or loss of voice. Laryngitis can be temporary or long-term. Treatment for laryngitis depends on the cause. It often involves resting your voice and using medicine to soothe your throat. Get help right away if you have difficulty swallowing or breathing or if you cough up blood. This information is not intended to replace advice given to you by your health care provider. Make sure you discuss any questions you have with your health care provider. Document Revised: 11/28/2020 Document Reviewed: 11/28/2020 Elsevier Patient Education  2024 Elsevier Inc.    Signed,   Caro Christmas, MD [facility], [facility] 11/04/23 10:56 AM

## 2023-11-05 ENCOUNTER — Encounter: Payer: Self-pay | Admitting: Family Medicine

## 2023-11-05 DIAGNOSIS — R7989 Other specified abnormal findings of blood chemistry: Secondary | ICD-10-CM

## 2023-11-06 NOTE — Telephone Encounter (Signed)
Patient would like to know how concerning the abnormal thyroid level is  She has elected to retest in 6 weeks and see if medication is necessary   TSH future ordered working on scheduling lab

## 2023-11-27 ENCOUNTER — Ambulatory Visit
Admission: RE | Admit: 2023-11-27 | Discharge: 2023-11-27 | Disposition: A | Discharge status: Home / Self Care | Payer: Medicare HMO | Source: Ambulatory Visit | Attending: Family Medicine | Admitting: Family Medicine

## 2023-11-27 DIAGNOSIS — Z1231 Encounter for screening mammogram for malignant neoplasm of breast: Secondary | ICD-10-CM

## 2023-12-18 ENCOUNTER — Other Ambulatory Visit (INDEPENDENT_AMBULATORY_CARE_PROVIDER_SITE_OTHER): Payer: Medicare HMO

## 2023-12-18 DIAGNOSIS — R7989 Other specified abnormal findings of blood chemistry: Secondary | ICD-10-CM | POA: Diagnosis not present

## 2023-12-19 LAB — TSH: TSH: 6.55 u[IU]/mL — ABNORMAL HIGH (ref 0.35–5.50)

## 2023-12-22 ENCOUNTER — Encounter: Payer: Self-pay | Admitting: Family Medicine

## 2023-12-22 DIAGNOSIS — R7989 Other specified abnormal findings of blood chemistry: Secondary | ICD-10-CM

## 2023-12-24 NOTE — Telephone Encounter (Signed)
 Message sent to patient, TSH ordered for lab visit, please schedule lab visit in the next 6 weeks.

## 2023-12-24 NOTE — Telephone Encounter (Signed)
 Scheduled appointment

## 2023-12-24 NOTE — Telephone Encounter (Signed)
 Patient is okay with repeat testing however patient questions if they need to do anything or just do retest?

## 2024-01-07 ENCOUNTER — Ambulatory Visit (INDEPENDENT_AMBULATORY_CARE_PROVIDER_SITE_OTHER): Admitting: *Deleted

## 2024-01-07 DIAGNOSIS — Z0000 Encounter for general adult medical examination without abnormal findings: Secondary | ICD-10-CM | POA: Diagnosis not present

## 2024-01-07 NOTE — Patient Instructions (Signed)
 Andrea Morrow , Thank you for taking time to come for your Medicare Wellness Visit. I appreciate your ongoing commitment to your health goals. Please review the following plan we discussed and let me know if I can assist you in the future.   Screening recommendations/referrals: Colonoscopy: up to date Mammogram: up to date Bone Density: up to date Recommended yearly ophthalmology/optometry visit for glaucoma screening and checkup Recommended yearly dental visit for hygiene and checkup  Vaccinations: Influenza vaccine: up to date Pneumococcal vaccine: up to date Tdap vaccine: up to date Shingles vaccine: up to date       Preventive Care 65 Years and Older, Female Preventive care refers to lifestyle choices and visits with your health care provider that can promote health and wellness. What does preventive care include? A yearly physical exam. This is also called an annual well check. Dental exams once or twice a year. Routine eye exams. Ask your health care provider how often you should have your eyes checked. Personal lifestyle choices, including: Daily care of your teeth and gums. Regular physical activity. Eating a healthy diet. Avoiding tobacco and drug use. Limiting alcohol use. Practicing safe sex. Taking low-dose aspirin every day. Taking vitamin and mineral supplements as recommended by your health care provider. What happens during an annual well check? The services and screenings done by your health care provider during your annual well check will depend on your age, overall health, lifestyle risk factors, and family history of disease. Counseling  Your health care provider may ask you questions about your: Alcohol use. Tobacco use. Drug use. Emotional well-being. Home and relationship well-being. Sexual activity. Eating habits. History of falls. Memory and ability to understand (cognition). Work and work Astronomer. Reproductive health. Screening  You may  have the following tests or measurements: Height, weight, and BMI. Blood pressure. Lipid and cholesterol levels. These may be checked every 5 years, or more frequently if you are over 67 years old. Skin check. Lung cancer screening. You may have this screening every year starting at age 57 if you have a 30-pack-year history of smoking and currently smoke or have quit within the past 15 years. Fecal occult blood test (FOBT) of the stool. You may have this test every year starting at age 66. Flexible sigmoidoscopy or colonoscopy. You may have a sigmoidoscopy every 5 years or a colonoscopy every 10 years starting at age 60. Hepatitis C blood test. Hepatitis B blood test. Sexually transmitted disease (STD) testing. Diabetes screening. This is done by checking your blood sugar (glucose) after you have not eaten for a while (fasting). You may have this done every 1-3 years. Bone density scan. This is done to screen for osteoporosis. You may have this done starting at age 63. Mammogram. This may be done every 1-2 years. Talk to your health care provider about how often you should have regular mammograms. Talk with your health care provider about your test results, treatment options, and if necessary, the need for more tests. Vaccines  Your health care provider may recommend certain vaccines, such as: Influenza vaccine. This is recommended every year. Tetanus, diphtheria, and acellular pertussis (Tdap, Td) vaccine. You may need a Td booster every 10 years. Zoster vaccine. You may need this after age 14. Pneumococcal 13-valent conjugate (PCV13) vaccine. One dose is recommended after age 26. Pneumococcal polysaccharide (PPSV23) vaccine. One dose is recommended after age 32. Talk to your health care provider about which screenings and vaccines you need and how often you need them.  This information is not intended to replace advice given to you by your health care provider. Make sure you discuss any  questions you have with your health care provider. Document Released: 10/07/2015 Document Revised: 05/30/2016 Document Reviewed: 07/12/2015 Elsevier Interactive Patient Education  2017 [employer].  Fall Prevention in the Home Falls can cause injuries. They can happen to people of all ages. There are many things you can do to make your home safe and to help prevent falls. What can I do on the outside of my home? Regularly fix the edges of walkways and driveways and fix any cracks. Remove anything that might make you trip as you walk through a door, such as a raised step or threshold. Trim any bushes or trees on the path to your home. Use bright outdoor lighting. Clear any walking paths of anything that might make someone trip, such as rocks or tools. Regularly check to see if handrails are loose or broken. Make sure that both sides of any steps have handrails. Any raised decks and porches should have guardrails on the edges. Have any leaves, snow, or ice cleared regularly. Use sand or salt on walking paths during winter. Clean up any spills in your garage right away. This includes oil or grease spills. What can I do in the bathroom? Use night lights. Install grab bars by the toilet and in the tub and shower. Do not use towel bars as grab bars. Use non-skid mats or decals in the tub or shower. If you need to sit down in the shower, use a plastic, non-slip stool. Keep the floor dry. Clean up any water that spills on the floor as soon as it happens. Remove soap buildup in the tub or shower regularly. Attach bath mats securely with double-sided non-slip rug tape. Do not have throw rugs and other things on the floor that can make you trip. What can I do in the bedroom? Use night lights. Make sure that you have a light by your bed that is easy to reach. Do not use any sheets or blankets that are too big for your bed. They should not hang down onto the floor. Have a firm chair that has side  arms. You can use this for support while you get dressed. Do not have throw rugs and other things on the floor that can make you trip. What can I do in the kitchen? Clean up any spills right away. Avoid walking on wet floors. Keep items that you use a lot in easy-to-reach places. If you need to reach something above you, use a strong step stool that has a grab bar. Keep electrical cords out of the way. Do not use floor polish or wax that makes floors slippery. If you must use wax, use non-skid floor wax. Do not have throw rugs and other things on the floor that can make you trip. What can I do with my stairs? Do not leave any items on the stairs. Make sure that there are handrails on both sides of the stairs and use them. Fix handrails that are broken or loose. Make sure that handrails are as long as the stairways. Check any carpeting to make sure that it is firmly attached to the stairs. Fix any carpet that is loose or worn. Avoid having throw rugs at the top or bottom of the stairs. If you do have throw rugs, attach them to the floor with carpet tape. Make sure that you have a light switch at the  top of the stairs and the bottom of the stairs. If you do not have them, ask someone to add them for you. What else can I do to help prevent falls? Wear shoes that: Do not have high heels. Have rubber bottoms. Are comfortable and fit you well. Are closed at the toe. Do not wear sandals. If you use a stepladder: Make sure that it is fully opened. Do not climb a closed stepladder. Make sure that both sides of the stepladder are locked into place. Ask someone to hold it for you, if possible. Clearly mark and make sure that you can see: Any grab bars or handrails. First and last steps. Where the edge of each step is. Use tools that help you move around (mobility aids) if they are needed. These include: Canes. Walkers. Scooters. Crutches. Turn on the lights when you go into a dark area.  Replace any light bulbs as soon as they burn out. Set up your furniture so you have a clear path. Avoid moving your furniture around. If any of your floors are uneven, fix them. If there are any pets around you, be aware of where they are. Review your medicines with your doctor. Some medicines can make you feel dizzy. This can increase your chance of falling. Ask your doctor what other things that you can do to help prevent falls. This information is not intended to replace advice given to you by your health care provider. Make sure you discuss any questions you have with your health care provider. Document Released: 07/07/2009 Document Revised: 02/16/2016 Document Reviewed: 10/15/2014 Elsevier Interactive Patient Education  2017 [employer].

## 2024-01-07 NOTE — Progress Notes (Addendum)
 Subjective:   Andrea Morrow is a 73 y.o. female who presents for Medicare Annual (Subsequent) preventive examination.  Visit Complete: Virtual I connected with  CRYTAL PENSINGER on 01/07/24 by a audio enabled telemedicine application and verified that I am speaking with the correct person using two identifiers.  Patient Location: Home  Provider Location: Home Office  I discussed the limitations of evaluation and management by telemedicine. The patient expressed understanding and agreed to proceed.  Vital Signs: Because this visit was a virtual/telehealth visit, some criteria may be missing or patient reported. Any vitals not documented were not able to be obtained and vitals that have been documented are patient reported.  Cardiac Risk Factors include: advanced age (>16men, >69 women);hypertension     Objective:    There were no vitals filed for this visit. There is no height or weight on file to calculate BMI.     01/07/2024    1:15 PM 12/13/2022   10:31 AM 06/08/2022    8:49 PM 10/26/2021    9:07 AM 10/24/2020    2:44 PM 02/27/2019    9:02 AM 02/25/2018    8:40 AM  Advanced Directives  Does Patient Have a Medical Advance Directive? Yes Yes No Yes Yes No No  Type of Sales promotion account executive of [employer] Power of [place];Living will;Out of facility DNR (pink MOST or yellow form)    Does patient want to make changes to medical advance directive?    No - Patient declined     Copy of Healthcare Power of Attorney in Chart? No - copy requested No - copy requested   No - copy requested    Would patient like information on creating a medical advance directive?    No - Patient declined  Yes (ED - Information included in AVS) Yes (MAU/Ambulatory/Procedural Areas - Information given)    Current Medications (verified) Outpatient Encounter Medications as of 01/07/2024  Medication Sig   Ascorbic Acid (VITAMIN C) 1000 MG tablet Take 1,000 mg by  mouth daily.   cholecalciferol (VITAMIN D3) 25 MCG (1000 UNIT) tablet Take 1,000 Units by mouth daily.   Ferrous Gluconate-C-Folic Acid (IRON-C PO) Take by mouth.   hydrochlorothiazide (MICROZIDE) 12.5 MG capsule Take 1 capsule (12.5 mg total) by mouth daily.   Multiple Vitamins-Calcium (ONE-A-DAY WOMENS PO) Take by mouth.   rosuvastatin (CRESTOR) 10 MG tablet Take 1 tablet (10 mg total) by mouth daily.   zinc gluconate 50 MG tablet Take 50 mg by mouth daily.   benzonatate (TESSALON) 100 MG capsule Take 1 capsule (100 mg total) by mouth 3 (three) times daily as needed for cough.   No facility-administered encounter medications on file as of 01/07/2024.    Allergies (verified) Poison ivy extract   History: Past Medical History:  Diagnosis Date   Hyperlipidemia    Hypertension    Past Surgical History:  Procedure Laterality Date   TUBAL LIGATION     Family History  Problem Relation Age of Onset   Cancer Mother    Hyperlipidemia Mother    Heart disease Brother    Colon cancer Neg Hx    Esophageal cancer Neg Hx    Pancreatic cancer Neg Hx    Rectal cancer Neg Hx    Stomach cancer Neg Hx    Social History   Socioeconomic History   Marital status: Married    Spouse name: Not on file   Number of children: Not on  file   Years of education: Not on file   Highest education level: Not on file  Occupational History   Not on file  Tobacco Use   Smoking status: Never   Smokeless tobacco: Never  Vaping Use   Vaping status: Never Used  Substance and Sexual Activity   Alcohol use: No   Drug use: No   Sexual activity: Not Currently  Other Topics Concern   Not on file  Social History Narrative   Married   Barrister's clerk   Social Drivers of Health   Financial Resource Strain: Low Risk  (01/07/2024)   Overall Financial Resource Strain (CARDIA)    Difficulty of Paying Living Expenses: Not hard at all  Food Insecurity: No Food Insecurity (01/07/2024)   Hunger Vital Sign     Worried About Running Out of Food in the Last Year: Never true    Ran Out of Food in the Last Year: Never true  Transportation Needs: No Transportation Needs (01/07/2024)   PRAPARE - Administrator, Civil Service (Medical): No    Lack of Transportation (Non-Medical): No  Physical Activity: Insufficiently Active (01/07/2024)   Exercise Vital Sign    Days of Exercise per Week: 3 days    Minutes of Exercise per Session: 40 min  Stress: No Stress Concern Present (01/07/2024)   [employer] of Occupational Health - Occupational Stress Questionnaire    Feeling of Stress : Not at all  Social Connections: Moderately Integrated (01/07/2024)   Social Connection and Isolation Panel [NHANES]    Frequency of Communication with Friends and Family: More than three times a week    Frequency of Social Gatherings with Friends and Family: Twice a week    Attends Religious Services: More than 4 times per year    Active Member of [employer] or Organizations: No    Attends Engineer, structural: Never    Marital Status: Married    Tobacco Counseling Counseling given: Not Answered   Clinical Intake:  Pre-visit preparation completed: Yes  Pain : No/denies pain     Diabetes: No  How often do you need to have someone help you when you read instructions, pamphlets, or other written materials from your doctor or pharmacy?: 1 - Never  Interpreter Needed?: No  Information entered by :: Remi Haggard LPN   Activities of Daily Living    01/07/2024    1:34 PM  In your present state of health, do you have any difficulty performing the following activities:  Hearing? 0  Vision? 0  Difficulty concentrating or making decisions? 0  Walking or climbing stairs? 0  Dressing or bathing? 0  Doing errands, shopping? 0  Preparing Food and eating ? N  Using the Toilet? N  In the past six months, have you accidently leaked urine? N  Do you have problems with loss of bowel control? N  Managing  your Medications? N  Managing your Finances? N  Housekeeping or managing your Housekeeping? N    Patient Care Team: Shade Flood, MD as PCP - General (Family Medicine) Jodelle Red, MD as PCP - Cardiology (Cardiology) Jodelle Red, MD as Consulting Physician (Cardiology)  Indicate any recent Medical Services you may have received from other than [facility] providers in the past year (date may be approximate).     Assessment:   This is a routine wellness examination for Saylor.  Hearing/Vision screen Hearing Screening - Comments:: Not trouble hearing Vision Screening - Comments:: [facility] Up  to date   Goals Addressed             This Visit's Progress    Patient Stated       Continue current lifestyle       Depression Screen    01/07/2024    1:16 PM 11/04/2023   10:08 AM 05/01/2023   10:54 AM 12/13/2022   10:36 AM 10/29/2022    9:18 AM 02/05/2022    1:23 PM 10/26/2021    9:04 AM  PHQ 2/9 Scores  PHQ - 2 Score 0 0 0 0 0 0 0  PHQ- 9 Score 0 0 0 1   0    Fall Risk    01/07/2024    1:14 PM 11/04/2023   10:09 AM 05/01/2023   10:53 AM 12/13/2022   10:31 AM 10/29/2022    9:18 AM  Fall Risk   Falls in the past year? 0 0 0 0 0  Number falls in past yr: 0 0 0 0 1  Injury with Fall? 0 0 0 0 0  Risk for fall due to :  No Fall Risks No Fall Risks  History of fall(s)  Follow up Falls evaluation completed;Education provided;Falls prevention discussed Falls evaluation completed Falls evaluation completed Education provided;Falls evaluation completed;Falls prevention discussed Education provided    MEDICARE RISK AT HOME: Medicare Risk at Home Any stairs in or around the home?: Yes If so, are there any without handrails?: No Home free of loose throw rugs in walkways, pet beds, electrical cords, etc?: Yes Adequate lighting in your home to reduce risk of falls?: Yes Life alert?: No Use of a cane, walker or w/c?: No Grab bars in the bathroom?: Yes Shower chair or  bench in shower?: Yes Elevated toilet seat or a handicapped toilet?: Yes  TIMED UP AND GO:  Was the test performed?  No    Cognitive Function:        01/07/2024    1:14 PM 12/13/2022   10:32 AM 10/26/2021    9:04 AM 10/24/2020    2:39 PM 02/27/2019    9:00 AM  6CIT Screen  What Year? 0 points 0 points 0 points 0 points 0 points  What month? 0 points 0 points 0 points 0 points 0 points  What time? 0 points 0 points 0 points 0 points 0 points  Count back from 20 0 points 0 points 0 points 0 points 0 points  Months in reverse 0 points 0 points 0 points 0 points 0 points  Repeat phrase 0 points 0 points 0 points 0 points 0 points  Total Score 0 points 0 points 0 points 0 points 0 points    Immunizations Immunization History  Administered Date(s) Administered   Fluad Quad(high Dose 65+) 09/10/2019, 08/22/2020, 06/08/2022   Fluad Trivalent(High Dose 65+) 06/05/2023   Influenza,inj,Quad PF,6+ Mos 08/27/2017   Pneumococcal Conjugate-13 02/25/2018   Pneumococcal Polysaccharide-23 02/27/2019   Tdap 09/27/2014   Zoster Recombinant(Shingrix) 04/08/2018, 07/02/2018    TDAP status: Up to date  Flu Vaccine status: Up to date  Pneumococcal vaccine status: Up to date  Covid-19 vaccine status: Information provided on how to obtain vaccines.   Qualifies for Shingles Vaccine? No   Zostavax completed Yes   Shingrix Completed?: Yes  Screening Tests Health Maintenance  Topic Date Due   COVID-19 Vaccine (1 - 2024-25 season) Never done   INFLUENZA VACCINE  04/24/2024   DTaP/Tdap/Td (2 - Td or Tdap) 09/27/2024   Medicare Annual Wellness (AWV)  01/06/2025   MAMMOGRAM  11/26/2025   Colonoscopy  10/08/2027   Pneumonia Vaccine 69+ Years old  Completed   DEXA SCAN  Completed   Hepatitis C Screening  Completed   Zoster Vaccines- Shingrix  Completed   HPV VACCINES  Aged Out   Meningococcal B Vaccine  Aged Out    Health Maintenance  Health Maintenance Due  Topic Date Due   COVID-19  Vaccine (1 - 2024-25 season) Never done    Colorectal cancer screening: Type of screening: Colonoscopy. Completed 2019. Repeat every 10 years  Mammogram status: Completed  . Repeat every year  Bone Density status: Completed 2024. Results reflect: Bone density results: NORMAL. Repeat every 5 years.  Lung Cancer Screening: (Low Dose CT Chest recommended if Age 76-80 years, 20 pack-year currently smoking OR have quit w/in 15years.) does not qualify.   Lung Cancer Screening Referral:   Additional Screening:  Hepatitis C Screening: does not qualify; Completed 2018  Vision Screening: Recommended annual ophthalmology exams for early detection of glaucoma and other disorders of the eye. Is the patient up to date with their annual eye exam?  Yes  Who is the provider or what is the name of the office in which the patient attends annual eye exams? [facility] If pt is not established with a provider, would they like to be referred to a provider to establish care? No .   Dental Screening: Recommended annual dental exams for proper oral hygiene   Community Resource Referral / Chronic Care Management: CRR required this visit?  No   CCM required this visit?  No     Plan:     I have personally reviewed and noted the following in the patient's chart:   Medical and social history Use of alcohol, tobacco or illicit drugs  Current medications and supplements including opioid prescriptions. Patient is not currently taking opioid prescriptions. Functional ability and status Nutritional status Physical activity Advanced directives List of other physicians Hospitalizations, surgeries, and ER visits in previous 12 months Vitals Screenings to include cognitive, depression, and falls Referrals and appointments  In addition, I have reviewed and discussed with patient certain preventive protocols, quality metrics, and best practice recommendations. A written personalized care plan for preventive  services as well as general preventive health recommendations were provided to patient.     Kieth Pelt, LPN   [id number]/61/0960   After Visit Summary: (MyChart) Due to this being a telephonic visit, the after visit summary with patients personalized plan was offered to patient via MyChart   Nurse Notes:

## 2024-01-28 ENCOUNTER — Other Ambulatory Visit (INDEPENDENT_AMBULATORY_CARE_PROVIDER_SITE_OTHER)

## 2024-01-28 DIAGNOSIS — R946 Abnormal results of thyroid function studies: Secondary | ICD-10-CM

## 2024-01-28 DIAGNOSIS — R7989 Other specified abnormal findings of blood chemistry: Secondary | ICD-10-CM | POA: Diagnosis not present

## 2024-01-28 LAB — TSH: TSH: 6.13 u[IU]/mL — ABNORMAL HIGH (ref 0.35–5.50)

## 2024-02-04 ENCOUNTER — Ambulatory Visit: Payer: Self-pay | Admitting: Family Medicine

## 2024-02-04 NOTE — Telephone Encounter (Signed)
 Patient would like to keep monitoring.  Patient is wondering if there is something more she should be doing to keep these levels down?

## 2024-02-13 ENCOUNTER — Encounter: Payer: Self-pay | Admitting: Emergency Medicine

## 2024-02-13 ENCOUNTER — Ambulatory Visit: Admission: EM | Admit: 2024-02-13 | Discharge: 2024-02-13 | Disposition: A | Discharge status: Home / Self Care

## 2024-02-13 DIAGNOSIS — B9789 Other viral agents as the cause of diseases classified elsewhere: Secondary | ICD-10-CM

## 2024-02-13 DIAGNOSIS — J019 Acute sinusitis, unspecified: Secondary | ICD-10-CM

## 2024-02-13 DIAGNOSIS — B309 Viral conjunctivitis, unspecified: Secondary | ICD-10-CM | POA: Diagnosis not present

## 2024-02-13 MED ORDER — PSEUDOEPH-BROMPHEN-DM 30-2-10 MG/5ML PO SYRP: 10.0000 mL | ORAL_SOLUTION | Freq: Four times a day (QID) | ORAL | 0 refills | Status: DC | PRN

## 2024-02-13 MED ORDER — FLUTICASONE PROPIONATE 50 MCG/ACT NA SUSP: 2.0000 | Freq: Every day | NASAL | 0 refills | Status: DC

## 2024-02-13 NOTE — ED Provider Notes (Signed)
 [facility]    CSN: [id number] Arrival date & time: 02/13/24  0848      History   Chief Complaint Chief Complaint  Patient presents with   Cough   Eye Problem   Nasal Congestion    HPI Andrea Morrow is a 73 y.o. female.   Andrea Morrow is a 73 y.o. female that presents with concerns of possible pink eye. The patient reports that her right eye became swollen and crusted yesterday, with symptoms worsening this morning. She states that she woke up around midnight with her eye shut closed. She used a  warm compress to help open it. The eye remains puffy but there is no pain or itching reported. Vision is unaffected. Concurrent with the eye symptoms, the patient has been experiencing a "crappy cough" for over a week that is not productive. Associated symptoms include chills, possible low-grade fever, nasal congestion, intermittent ear discomfort, runny nose, and sneezing. She states that she felt some postnasal drip a few days ago but that has since resolved. The patient denies sinus pain or pressure, sore throat, wheezing, shortness of breath, nausea, vomiting, or diarrhea. A headache occurred 2-3 days ago but resolved with Tylenol . The patient has attempted self-treatment with Nyquil, DayQuil, and Mucinex-D, but reports feeling worse despite these interventions. She denies any known exposure to sick individuals. The patient wears glasses and does not smoke or vape.  The following portions of the patient's history were reviewed and updated as appropriate: allergies, current medications, past family history, past medical history, past social history, past surgical history, and problem list.    Past Medical History:  Diagnosis Date   Hyperlipidemia    Hypertension     Patient Active Problem List   Diagnosis Date Noted   Prediabetes 10/29/2022   Laryngopharyngeal reflux (LPR) 07/31/2022   Hyperlipidemia 03/30/2016   Essential hypertension 03/30/2016   Breast cancer  screening 01/12/2015   Other screening mammogram 01/28/2014    Past Surgical History:  Procedure Laterality Date   TUBAL LIGATION      OB History   No obstetric history on file.      Home Medications    Prior to Admission medications   Medication Sig Start Date End Date Taking? Authorizing Provider  Ascorbic Acid (VITAMIN C) 1000 MG tablet Take 1,000 mg by mouth daily.   Yes [provider]  brompheniramine-pseudoephedrine-DM 30-2-10 MG/5ML syrup Take 10 mLs by mouth every 6 (six) hours as needed (cough and congestion). 02/13/24  Yes Maryruth Sol, FNP  cholecalciferol (VITAMIN D3) 25 MCG (1000 UNIT) tablet Take 1,000 Units by mouth daily.   Yes [provider]  ferrous gluconate (FERGON) 324 MG tablet Take 324 mg by mouth.   Yes [provider]  fluticasone (FLONASE) 50 MCG/ACT nasal spray Place 2 sprays into both nostrils daily. Spray 2 sprays in each nostril once daily. Shake well before use. Gently blow nose before spraying. Do not blow nose immediately after use. You should not taste the medication or feel it going down your throat; if you do, adjust your technique. 02/13/24  Yes Maryruth Sol, FNP  hydrochlorothiazide  (MICROZIDE ) 12.5 MG capsule Take 1 capsule (12.5 mg total) by mouth daily. 05/01/23  Yes Benjiman Bras, MD  Multiple Vitamins-Calcium  (ONE-A-DAY WOMENS PO) Take by mouth.   Yes [provider]  rosuvastatin  (CRESTOR ) 10 MG tablet Take 1 tablet (10 mg total) by mouth daily. 05/01/23  Yes Benjiman Bras, MD  zinc gluconate 50 MG  tablet Take 50 mg by mouth daily.   Yes [provider]  Ferrous Gluconate-C-Folic Acid (IRON-C PO) Take by mouth. Patient not taking: Reported on 02/13/2024    [provider]    Family History Family History  Problem Relation Age of Onset   Cancer Mother    Hyperlipidemia Mother    Heart disease Brother    Colon cancer Neg Hx    Esophageal cancer Neg Hx    Pancreatic  cancer Neg Hx    Rectal cancer Neg Hx    Stomach cancer Neg Hx     Social History Social History   Tobacco Use   Smoking status: Never    Passive exposure: Never   Smokeless tobacco: Never  Vaping Use   Vaping status: Never Used  Substance Use Topics   Alcohol use: No   Drug use: No     Allergies   Poison ivy extract   Review of Systems Review of Systems  Constitutional:  Positive for fever (subjective, low grade "felt warm"). Negative for chills.  HENT:  Positive for congestion, ear pain (intermittent, bilaterally), rhinorrhea and sneezing. Negative for postnasal drip, sinus pressure, sinus pain and sore throat.   Eyes:  Positive for discharge and redness. Negative for pain, itching and visual disturbance.  Respiratory:  Positive for cough (nonproductive). Negative for shortness of breath and wheezing.   Gastrointestinal:  Negative for diarrhea, nausea and vomiting.  Musculoskeletal:  Negative for myalgias.  Neurological:  Positive for headaches.  All other systems reviewed and are negative.    Physical Exam Triage Vital Signs ED Triage Vitals [02/13/24 1044]  Encounter Vitals Group     BP (!) 141/82     Systolic BP Percentile      Diastolic BP Percentile      Pulse Rate 79     Resp 16     Temp 98 F (36.7 C)     Temp Source Oral     SpO2 94 %     Weight      Height      Head Circumference      Peak Flow      Pain Score 0     Pain Loc      Pain Education      Exclude from Growth Chart    No data found.  Updated Vital Signs BP (!) 141/82 (BP Location: Left Arm)   Pulse 79   Temp 98 F (36.7 C) (Oral)   Resp 16   SpO2 94%   Visual Acuity Right Eye Distance:   Left Eye Distance:   Bilateral Distance:    Right Eye Near:   Left Eye Near:    Bilateral Near:     Physical Exam Vitals reviewed.  Constitutional:      General: She is awake. She is not in acute distress.    Appearance: Normal appearance. She is well-developed. She is not  ill-appearing, toxic-appearing or diaphoretic.  HENT:     Head: Normocephalic.     Right Ear: Hearing, tympanic membrane, ear canal and external ear normal. No drainage, swelling or tenderness. No middle ear effusion. Tympanic membrane is not erythematous.     Left Ear: Hearing, tympanic membrane, ear canal and external ear normal. No drainage, swelling or tenderness.  No middle ear effusion. Tympanic membrane is not erythematous.     Nose: Congestion present.     Right Sinus: No maxillary sinus tenderness or frontal sinus tenderness.     Left  Sinus: No maxillary sinus tenderness or frontal sinus tenderness.     Mouth/Throat:     Lips: Pink.     Mouth: Mucous membranes are moist.     Pharynx: Oropharynx is clear. Uvula midline. No pharyngeal swelling, oropharyngeal exudate, posterior oropharyngeal erythema or uvula swelling.     Tonsils: No tonsillar exudate or tonsillar abscesses.  Eyes:     General: Lids are normal. Vision grossly intact. No visual field deficit.       Right eye: No discharge.        Left eye: No discharge.     Extraocular Movements: Extraocular movements intact.     Conjunctiva/sclera: Conjunctivae normal.     Right eye: Right conjunctiva is not injected.     Left eye: Left conjunctiva is not injected.     Pupils: Pupils are equal, round, and reactive to light.  Cardiovascular:     Rate and Rhythm: Normal rate and regular rhythm.     Heart sounds: Normal heart sounds.  Pulmonary:     Effort: Pulmonary effort is normal. No tachypnea or respiratory distress.     Breath sounds: Normal breath sounds and air entry. No decreased air movement. No decreased breath sounds, wheezing or rhonchi.  Musculoskeletal:        General: Normal range of motion.     Cervical back: Full passive range of motion without pain, normal range of motion and neck supple.  Lymphadenopathy:     Cervical: No cervical adenopathy.  Skin:    General: Skin is warm and dry.  Neurological:      General: No focal deficit present.     Mental Status: She is alert and oriented to person, place, and time.  Psychiatric:        Mood and Affect: Mood normal.        Behavior: Behavior normal. Behavior is cooperative.     UC Treatments / Results  Labs (all labs ordered are listed, but only abnormal results are displayed) Labs Reviewed - No data to display  EKG   Radiology No results found.  Procedures Procedures (including critical care time)  Medications Ordered in UC Medications - No data to display  Initial Impression / Assessment and Plan / UC Course  I have reviewed the triage vital signs and the nursing notes.  Pertinent labs & imaging results that were available during my care of the patient were reviewed by me and considered in my medical decision making (see chart for details).    Patient presents with symptoms consistent with viral sinusitis and viral conjunctivitis, including over a week of nasal congestion, runny nose, sneezing, and cough, followed by new onset of right eye swelling, crusting, and matting, with early involvement of the left eye. Reports possible low-grade fever and chills without measured temperature. No improvement with OTC medications. Based on the duration/pattern of symptoms and physical exam, viral sinusitis with viral conjunctivitis is the most likely diagnosis. Bromfed DM and Flonase  prescribed for symptom relief, and OTC artificial tears recommended for eye discomfort. Supportive care discussed, including warm compresses, hydration, and hygiene measures. Patient educated on the viral nature of illness, expected self-limited course, and advised to clean bedding and high-touch surfaces to reduce spread of illness.   Today's evaluation has revealed no signs of a dangerous process. Discussed diagnosis with patient and/or guardian. Patient and/or guardian aware of their diagnosis, possible red flag symptoms to watch out for and need for close follow  up. Patient and/or guardian understands verbal  and written discharge instructions. Patient and/or guardian comfortable with plan and disposition.  Patient and/or guardian has a clear mental status at this time, good insight into illness (after discussion and teaching) and has clear judgment to make decisions regarding their care  Documentation was completed with the aid of voice recognition software. Transcription may contain typographical errors.  Final Clinical Impressions(s) / UC Diagnoses   Final diagnoses:  Viral conjunctivitis, unspecified  Acute viral sinusitis     Discharge Instructions      You were seen today for symptoms consistent with viral sinusitis and viral conjunctivitis. Viral sinusitis is inflammation of the sinuses caused by a virus. It can lead to symptoms such as facial pressure, nasal congestion, post-nasal drip, and cough. Viral conjunctivitis, often called "pink eye," is an infection of the eye that causes redness, irritation, and watery discharge. Both conditions are caused by viruses and usually improve with time and supportive care.  To help manage your symptoms, Flonase will be prescribed to reduce nasal inflammation, and Bromfed DM will help relieve your cough and post-nasal drip. You may also use over-the-counter Refresh eye drops as needed to soothe eye irritation and dryness.  These conditions typically improve on their own within a week or two. However, you should follow up with your primary care provider if your symptoms get worse or do not begin to improve after 7 to 10 days, if you develop a fever, worsening facial pain, swelling around the eyes, vision changes, or yellow or green eye discharge. Continue to rest, stay hydrated, and avoid touching or rubbing your eyes to prevent spreading the infection.    ED Prescriptions     Medication Sig Dispense Auth. Provider   fluticasone (FLONASE) 50 MCG/ACT nasal spray Place 2 sprays into both nostrils daily.  Spray 2 sprays in each nostril once daily. Shake well before use. Gently blow nose before spraying. Do not blow nose immediately after use. You should not taste the medication or feel it going down your throat; if you do, adjust your technique. 16 g Maryruth Sol, FNP   brompheniramine-pseudoephedrine-DM 30-2-10 MG/5ML syrup Take 10 mLs by mouth every 6 (six) hours as needed (cough and congestion). 120 mL Maryruth Sol, FNP      PDMP not reviewed this encounter.   Maryruth Sol, [place] 02/13/24 1214

## 2024-02-13 NOTE — Discharge Instructions (Addendum)
 You were seen today for symptoms consistent with viral sinusitis and viral conjunctivitis. Viral sinusitis is inflammation of the sinuses caused by a virus. It can lead to symptoms such as facial pressure, nasal congestion, post-nasal drip, and cough. Viral conjunctivitis, often called "pink eye," is an infection of the eye that causes redness, irritation, and watery discharge. Both conditions are caused by viruses and usually improve with time and supportive care.  To help manage your symptoms, Flonase will be prescribed to reduce nasal inflammation, and Bromfed DM will help relieve your cough and post-nasal drip. You may also use over-the-counter Refresh eye drops as needed to soothe eye irritation and dryness.  These conditions typically improve on their own within a week or two. However, you should follow up with your primary care provider if your symptoms get worse or do not begin to improve after 7 to 10 days, if you develop a fever, worsening facial pain, swelling around the eyes, vision changes, or yellow or green eye discharge. Continue to rest, stay hydrated, and avoid touching or rubbing your eyes to prevent spreading the infection.

## 2024-02-13 NOTE — ED Triage Notes (Addendum)
 Pt reports redness to R eye with irritation that started last night. Woke up and her eye was closed shut - had to use warm compress. States her grandchildren recently had similar eye issues. Denies vision changes.  Also reports nasal and chest congestion with productive cough x1 week. Denies fevers, chills, and sore throat. Dayquil/nyquil and mucinex DM with no relief.

## 2024-04-14 ENCOUNTER — Encounter: Payer: Self-pay | Admitting: Pharmacist

## 2024-04-14 NOTE — Progress Notes (Signed)
 Pharmacy Quality Measure Review  This patient is appearing on a report for being at risk of failing the adherence measure for cholesterol (statin) medications this calendar year.   Medication: rosuvastatin  10mg  Last fill date: 11/13/2023 for 90 day supply  Spoke witt patient - she actually had requested refill form [facility] yesterday. States they usually have it on auto refill but she only has 1 tablet left. Called [facility] and they have rosuvastatin  ready for her to pick up.  She will see Dr Levora next month; noted that she will need updated Rx for rosuvastatin  in appt notes.  Reviewed medication indication, dosing, and goals of therapy.   Madelin Ray, PharmD Clinical Pharmacist [facility]  Population Health [phone]

## 2024-05-04 ENCOUNTER — Ambulatory Visit: Payer: Medicare HMO | Admitting: Family Medicine

## 2024-06-01 ENCOUNTER — Ambulatory Visit: Admitting: Family Medicine

## 2024-06-08 ENCOUNTER — Other Ambulatory Visit: Payer: Self-pay | Admitting: Family Medicine

## 2024-06-08 DIAGNOSIS — I10 Essential (primary) hypertension: Secondary | ICD-10-CM

## 2024-06-22 ENCOUNTER — Ambulatory Visit: Admitting: Family Medicine

## 2024-06-22 VITALS — BP 108/82 | HR 71 | Temp 98.3°F | Resp 16 | Ht 61.25 in | Wt 167.4 lb

## 2024-06-22 DIAGNOSIS — R7303 Prediabetes: Secondary | ICD-10-CM | POA: Diagnosis not present

## 2024-06-22 DIAGNOSIS — R7989 Other specified abnormal findings of blood chemistry: Secondary | ICD-10-CM | POA: Diagnosis not present

## 2024-06-22 DIAGNOSIS — I10 Essential (primary) hypertension: Secondary | ICD-10-CM

## 2024-06-22 DIAGNOSIS — Z23 Encounter for immunization: Secondary | ICD-10-CM | POA: Diagnosis not present

## 2024-06-22 DIAGNOSIS — E785 Hyperlipidemia, unspecified: Secondary | ICD-10-CM | POA: Diagnosis not present

## 2024-06-22 MED ORDER — HYDROCHLOROTHIAZIDE 12.5 MG PO CAPS: 12.5000 mg | ORAL_CAPSULE | Freq: Every day | ORAL | 3 refills | Status: AC

## 2024-06-22 NOTE — Progress Notes (Signed)
 Subjective:  Patient ID: Andrea Morrow, female    DOB: [birth year]  Age: 73 y.o. MRN: [id number]  CC:  Chief Complaint  Patient presents with   Hypertension   Hyperlipidemia    No questions or concerns.     HPI [name] presents for  Chronic condition follow-up.  Feels like doing well. No acute concerns.   Elevated TSH No meds. Stable on repeat testing.  No new hot or cold intolerance. No new hair or skin changes, heart palpitations or new fatigue. No new weight changes.  Lab Results  Component Value Date   TSH 6.13 (H) 01/28/2024   Prediabetes: Diet/exercise approach.  Slight increase in weight today compared to February, minimal change from last August. Exercise - active with caring for young grandchildren.  Lab Results  Component Value Date   HGBA1C 6.3 11/04/2023   Wt Readings from Last 3 Encounters:  06/22/24 167 lb 6.4 oz (75.9 kg)  11/04/23 162 lb 6.4 oz (73.7 kg)  05/01/23 166 lb (75.3 kg)   Hypertension: Hydrochlorothiazide  12.5 mg daily. No recent lightheadedness.  Home readings: usually 120/80's. BP Readings from Last 3 Encounters:  06/22/24 108/82  02/13/24 (!) 141/82  11/04/23 128/60   Lab Results  Component Value Date   CREATININE 0.76 11/04/2023   Hyperlipidemia: Crestor  10mg  every day.  No new myalgias/side effects.  Lab Results  Component Value Date   CHOL 174 11/04/2023   HDL 68.60 11/04/2023   LDLCALC 87 11/04/2023   TRIG 93.0 11/04/2023   CHOLHDL 3 11/04/2023   Lab Results  Component Value Date   ALT 23 11/04/2023   AST 32 11/04/2023   ALKPHOS 77 11/04/2023   BILITOT 0.4 11/04/2023   HM: Up to date.  Flu vaccine given today.     History Patient Active Problem List   Diagnosis Date Noted   Prediabetes 10/29/2022   Laryngopharyngeal reflux (LPR) 07/31/2022   Hyperlipidemia 03/30/2016   Essential hypertension 03/30/2016   Breast cancer screening 01/12/2015   Other screening mammogram 01/28/2014   Past Medical  History:  Diagnosis Date   Hyperlipidemia    Hypertension    Past Surgical History:  Procedure Laterality Date   TUBAL LIGATION     Allergies  Allergen Reactions   Poison Ivy Extract Rash   Prior to Admission medications   Medication Sig Start Date End Date Taking? Authorizing Provider  Ascorbic Acid (VITAMIN C) 1000 MG tablet Take 1,000 mg by mouth daily.   Yes [provider]  cholecalciferol (VITAMIN D3) 25 MCG (1000 UNIT) tablet Take 1,000 Units by mouth daily.   Yes [provider]  hydrochlorothiazide  (MICROZIDE ) 12.5 MG capsule TAKE ONE CAPSULE BY MOUTH ONCE A DAY 06/08/24  Yes Levora Reyes SAUNDERS, MD  Multiple Vitamins-Calcium  (ONE-A-DAY WOMENS PO) Take by mouth.   Yes [provider]  rosuvastatin  (CRESTOR ) 10 MG tablet Take 1 tablet (10 mg total) by mouth daily. 05/01/23  Yes Levora Reyes SAUNDERS, MD  zinc gluconate 50 MG tablet Take 50 mg by mouth daily.   Yes [provider]  brompheniramine-pseudoephedrine-DM 30-2-10 MG/5ML syrup Take 10 mLs by mouth every 6 (six) hours as needed (cough and congestion). Patient not taking: Reported on 06/22/2024 02/13/24   Iola Lukes, FNP  ferrous gluconate (FERGON) 324 MG tablet Take 324 mg by mouth. Patient not taking: Reported on 06/22/2024    [provider]  Ferrous Gluconate-C-Folic Acid (IRON-C PO) Take by mouth. Patient not taking: Reported on 06/22/2024  [provider]  fluticasone  (FLONASE ) 50 MCG/ACT nasal spray Place 2 sprays into both nostrils daily. Spray 2 sprays in each nostril once daily. Shake well before use. Gently blow nose before spraying. Do not blow nose immediately after use. You should not taste the medication or feel it going down your throat; if you do, adjust your technique. Patient not taking: Reported on 06/22/2024 02/13/24   Iola Lukes, FNP   Social History   Socioeconomic History   Marital status: Married    Spouse name: Not on file   Number of  children: Not on file   Years of education: Not on file   Highest education level: Not on file  Occupational History   Not on file  Tobacco Use   Smoking status: Never    Passive exposure: Never   Smokeless tobacco: Never  Vaping Use   Vaping status: Never Used  Substance and Sexual Activity   Alcohol use: No   Drug use: No   Sexual activity: Not Currently  Other Topics Concern   Not on file  Social History Narrative   Married   Barrister's clerk   Social Drivers of Health   Financial Resource Strain: Low Risk  (01/07/2024)   Overall Financial Resource Strain (CARDIA)    Difficulty of Paying Living Expenses: Not hard at all  Food Insecurity: No Food Insecurity (01/07/2024)   Hunger Vital Sign    Worried About Running Out of Food in the Last Year: Never true    Ran Out of Food in the Last Year: Never true  Transportation Needs: No Transportation Needs (01/07/2024)   PRAPARE - Administrator, Civil Service (Medical): No    Lack of Transportation (Non-Medical): No  Physical Activity: Insufficiently Active (01/07/2024)   Exercise Vital Sign    Days of Exercise per Week: 3 days    Minutes of Exercise per Session: 40 min  Stress: No Stress Concern Present (01/07/2024)   [employer] of Occupational Health - Occupational Stress Questionnaire    Feeling of Stress : Not at all  Social Connections: Moderately Integrated (01/07/2024)   Social Connection and Isolation Panel    Frequency of Communication with Friends and Family: More than three times a week    Frequency of Social Gatherings with Friends and Family: Twice a week    Attends Religious Services: More than 4 times per year    Active Member of [employer] or Organizations: No    Attends Banker Meetings: Never    Marital Status: Married  Catering manager Violence: Not At Risk (01/07/2024)   Humiliation, Afraid, Rape, and Kick questionnaire    Fear of Current or Ex-Partner: No    Emotionally Abused: No     Physically Abused: No    Sexually Abused: No    Review of Systems  Constitutional:  Negative for fatigue and unexpected weight change.  Respiratory:  Negative for chest tightness and shortness of breath.   Cardiovascular:  Negative for chest pain, palpitations and leg swelling.  Gastrointestinal:  Negative for abdominal pain and blood in stool.  Neurological:  Negative for dizziness, syncope, light-headedness and headaches.     Objective:   Vitals:   06/22/24 1531  BP: 108/82  Pulse: 71  Resp: 16  Temp: 98.3 F (36.8 C)  TempSrc: Temporal  SpO2: 97%  Weight: 167 lb 6.4 oz (75.9 kg)  Height: 5' 1.25 (1.556 m)     Physical Exam Vitals reviewed.  Constitutional:  Appearance: Normal appearance. She is well-developed.  HENT:     Head: Normocephalic and atraumatic.  Eyes:     Conjunctiva/sclera: Conjunctivae normal.     Pupils: Pupils are equal, round, and reactive to light.  Neck:     Vascular: No carotid bruit.  Cardiovascular:     Rate and Rhythm: Normal rate and regular rhythm.     Heart sounds: Normal heart sounds.  Pulmonary:     Effort: Pulmonary effort is normal.     Breath sounds: Normal breath sounds.  Abdominal:     Palpations: Abdomen is soft. There is no pulsatile mass.     Tenderness: There is no abdominal tenderness.  Musculoskeletal:     Right lower leg: No edema.     Left lower leg: No edema.  Skin:    General: Skin is warm and dry.  Neurological:     Mental Status: She is alert and oriented to person, place, and time.  Psychiatric:        Mood and Affect: Mood normal.        Behavior: Behavior normal.        Assessment & Plan:  COLLETTA SPILLERS is a 73 y.o. female . Essential hypertension - Plan: Comprehensive metabolic panel with GFR, hydrochlorothiazide  (MICROZIDE ) 12.5 MG capsule, TSH  -  Stable, tolerating current regimen. Medications refilled. Labs pending as above.   Need for influenza vaccination - Plan: Flu vaccine HIGH  DOSE PF(Fluzone Trivalent)  Hyperlipidemia, unspecified hyperlipidemia type - Plan: Lipid panel  -  Stable, tolerating current regimen. Continue crestor  same dose.  Labs pending as above.   Prediabetes - Plan: Hemoglobin A1c  - check labs and adjust plan accordingly.   Abnormal thyroid  blood test - Plan: TSH  - mild but stable elevation of tsh prior - denies new sx's. Check updated tsh and adjust plan accordingly.   Meds ordered this encounter  Medications   hydrochlorothiazide  (MICROZIDE ) 12.5 MG capsule    Sig: Take 1 capsule (12.5 mg total) by mouth daily.    Dispense:  90 capsule    Refill:  3   Patient Instructions  Thank you for coming in today. No change in medications at this time. If there are any concerns on your bloodwork, I will let you know. Take care!     Signed,   Reyes Pines, MD [facility], [facility] 06/22/24 4:00 PM

## 2024-06-22 NOTE — Patient Instructions (Signed)
 Thank you for coming in today. No change in medications at this time. If there are any concerns on your bloodwork, I will let you know. Take care!

## 2024-06-23 LAB — COMPREHENSIVE METABOLIC PANEL WITH GFR
ALT: 14 U/L (ref 0–35)
AST: 24 U/L (ref 0–37)
Albumin: 4.6 g/dL (ref 3.5–5.2)
Alkaline Phosphatase: 63 U/L (ref 39–117)
BUN: 19 mg/dL (ref 6–23)
CO2: 31 meq/L (ref 19–32)
Calcium: 10.7 mg/dL — ABNORMAL HIGH (ref 8.4–10.5)
Chloride: 101 meq/L (ref 96–112)
Creatinine, Ser: 0.89 mg/dL (ref 0.40–1.20)
GFR: 65.07 mL/min (ref >60.00)
Glucose, Bld: 96 mg/dL (ref 70–99)
Potassium: 4.8 meq/L (ref 3.5–5.1)
Sodium: 140 meq/L (ref 135–145)
Total Bilirubin: 0.5 mg/dL (ref 0.2–1.2)
Total Protein: 7.5 g/dL (ref 6.0–8.3)

## 2024-06-23 LAB — LIPID PANEL
Cholesterol: 185 mg/dL (ref 0–200)
HDL: 75.3 mg/dL (ref >39.00)
LDL Cholesterol: 94 mg/dL (ref 0–99)
NonHDL: 109.29
Total CHOL/HDL Ratio: 2
Triglycerides: 76 mg/dL (ref 0.0–149.0)
VLDL: 15.2 mg/dL (ref 0.0–40.0)

## 2024-06-23 LAB — TSH: TSH: 5.54 u[IU]/mL — ABNORMAL HIGH (ref 0.35–5.50)

## 2024-06-23 LAB — HEMOGLOBIN A1C: Hgb A1c MFr Bld: 6.5 % (ref 4.6–6.5)

## 2024-06-23 NOTE — Progress Notes (Signed)
 Andrea Morrow                                          MRN: [id number]   06/23/2024   The [facility] Quality Team Specialist reviewed this patient medical record for the purposes of chart review for care gap closure. The following were reviewed: abstraction for care gap closure-controlling blood pressure.    [facility] Quality Team

## 2024-06-28 ENCOUNTER — Ambulatory Visit: Payer: Self-pay | Admitting: Family Medicine

## 2024-07-13 ENCOUNTER — Other Ambulatory Visit: Payer: Self-pay | Admitting: Family Medicine

## 2024-07-13 DIAGNOSIS — E785 Hyperlipidemia, unspecified: Secondary | ICD-10-CM

## 2024-10-08 ENCOUNTER — Other Ambulatory Visit: Payer: Self-pay | Admitting: Family Medicine

## 2024-10-08 DIAGNOSIS — E785 Hyperlipidemia, unspecified: Secondary | ICD-10-CM

## 2024-12-21 ENCOUNTER — Encounter: Admitting: Family Medicine

## 2025-01-12 ENCOUNTER — Encounter
